# Patient Record
Sex: Female | Born: 1935 | Race: White | Hispanic: No | Smoking: Never smoker
Health system: Southern US, Community
[De-identification: ages and names within clinical notes are randomized; demographics above are authoritative.]

## PROBLEM LIST (undated history)

## (undated) DIAGNOSIS — E039 Hypothyroidism, unspecified: Secondary | ICD-10-CM

## (undated) DIAGNOSIS — H905 Unspecified sensorineural hearing loss: Secondary | ICD-10-CM

## (undated) DIAGNOSIS — I10 Essential (primary) hypertension: Secondary | ICD-10-CM

## (undated) DIAGNOSIS — R079 Chest pain, unspecified: Secondary | ICD-10-CM

## (undated) DIAGNOSIS — C49A0 Gastrointestinal stromal tumor, unspecified site: Secondary | ICD-10-CM

## (undated) DIAGNOSIS — H409 Unspecified glaucoma: Secondary | ICD-10-CM

## (undated) DIAGNOSIS — H269 Unspecified cataract: Secondary | ICD-10-CM

## (undated) DIAGNOSIS — N289 Disorder of kidney and ureter, unspecified: Secondary | ICD-10-CM

## (undated) DIAGNOSIS — C801 Malignant (primary) neoplasm, unspecified: Secondary | ICD-10-CM

## (undated) HISTORY — DX: Chest pain, unspecified: R07.9

## (undated) HISTORY — DX: Hypothyroidism, unspecified: E03.9

## (undated) HISTORY — PX: ABDOMINAL HYSTERECTOMY: SHX81

## (undated) HISTORY — DX: Unspecified cataract: H26.9

## (undated) HISTORY — DX: Disorder of kidney and ureter, unspecified: N28.9

## (undated) HISTORY — DX: Gastrointestinal stromal tumor, unspecified site: C49.A0

## (undated) HISTORY — DX: Unspecified sensorineural hearing loss: H90.5

## (undated) HISTORY — DX: Essential (primary) hypertension: I10

## (undated) HISTORY — DX: Unspecified glaucoma: H40.9

---

## 1998-03-12 ENCOUNTER — Ambulatory Visit: Admission: RE | Admit: 1998-03-12 | Discharge: 1998-03-12 | Payer: Self-pay | Admitting: Internal Medicine

## 1998-03-14 ENCOUNTER — Encounter: Payer: Self-pay | Admitting: Internal Medicine

## 2000-04-02 ENCOUNTER — Encounter: Admission: RE | Admit: 2000-04-02 | Discharge: 2000-04-02 | Payer: Self-pay | Admitting: Internal Medicine

## 2000-04-02 ENCOUNTER — Encounter: Payer: Self-pay | Admitting: Internal Medicine

## 2001-04-05 ENCOUNTER — Encounter: Admission: RE | Admit: 2001-04-05 | Discharge: 2001-04-05 | Payer: Self-pay | Admitting: Internal Medicine

## 2001-04-05 ENCOUNTER — Encounter: Payer: Self-pay | Admitting: Internal Medicine

## 2001-07-30 ENCOUNTER — Ambulatory Visit (HOSPITAL_COMMUNITY): Admission: RE | Admit: 2001-07-30 | Discharge: 2001-07-30 | Payer: Self-pay | Admitting: Internal Medicine

## 2002-04-07 ENCOUNTER — Encounter: Payer: Self-pay | Admitting: Internal Medicine

## 2002-04-07 ENCOUNTER — Encounter: Admission: RE | Admit: 2002-04-07 | Discharge: 2002-04-07 | Payer: Self-pay | Admitting: Internal Medicine

## 2003-04-10 ENCOUNTER — Encounter: Admission: RE | Admit: 2003-04-10 | Discharge: 2003-04-10 | Payer: Self-pay | Admitting: Internal Medicine

## 2003-04-10 ENCOUNTER — Encounter: Payer: Self-pay | Admitting: Internal Medicine

## 2004-04-10 ENCOUNTER — Encounter: Admission: RE | Admit: 2004-04-10 | Discharge: 2004-04-10 | Payer: Self-pay | Admitting: Internal Medicine

## 2004-07-21 HISTORY — PX: STOMACH SURGERY: SHX791

## 2004-11-29 ENCOUNTER — Encounter: Payer: Self-pay | Admitting: Gastroenterology

## 2005-04-14 ENCOUNTER — Encounter: Admission: RE | Admit: 2005-04-14 | Discharge: 2005-04-14 | Payer: Self-pay | Admitting: Internal Medicine

## 2006-04-15 ENCOUNTER — Encounter: Admission: RE | Admit: 2006-04-15 | Discharge: 2006-04-15 | Payer: Self-pay | Admitting: Internal Medicine

## 2006-04-16 ENCOUNTER — Encounter: Admission: RE | Admit: 2006-04-16 | Discharge: 2006-04-16 | Payer: Self-pay | Admitting: Internal Medicine

## 2006-04-28 ENCOUNTER — Encounter: Admission: RE | Admit: 2006-04-28 | Discharge: 2006-04-28 | Payer: Self-pay | Admitting: Internal Medicine

## 2006-07-01 ENCOUNTER — Encounter: Admission: RE | Admit: 2006-07-01 | Discharge: 2006-07-01 | Payer: Self-pay | Admitting: Internal Medicine

## 2006-07-21 DIAGNOSIS — C49A0 Gastrointestinal stromal tumor, unspecified site: Secondary | ICD-10-CM

## 2006-07-21 HISTORY — DX: Gastrointestinal stromal tumor, unspecified site: C49.A0

## 2006-08-06 ENCOUNTER — Emergency Department (HOSPITAL_COMMUNITY): Admission: EM | Admit: 2006-08-06 | Discharge: 2006-08-06 | Payer: Self-pay | Admitting: Emergency Medicine

## 2007-04-19 ENCOUNTER — Encounter: Admission: RE | Admit: 2007-04-19 | Discharge: 2007-04-19 | Payer: Self-pay | Admitting: Internal Medicine

## 2007-07-23 ENCOUNTER — Ambulatory Visit: Payer: Self-pay | Admitting: Internal Medicine

## 2007-08-05 ENCOUNTER — Ambulatory Visit: Payer: Self-pay | Admitting: Internal Medicine

## 2007-12-10 ENCOUNTER — Encounter: Admission: RE | Admit: 2007-12-10 | Discharge: 2007-12-10 | Payer: Self-pay | Admitting: Internal Medicine

## 2007-12-11 ENCOUNTER — Encounter: Admission: RE | Admit: 2007-12-11 | Discharge: 2007-12-11 | Payer: Self-pay | Admitting: Internal Medicine

## 2007-12-14 ENCOUNTER — Telehealth: Payer: Self-pay | Admitting: Gastroenterology

## 2007-12-16 DIAGNOSIS — R933 Abnormal findings on diagnostic imaging of other parts of digestive tract: Secondary | ICD-10-CM

## 2007-12-30 ENCOUNTER — Ambulatory Visit (HOSPITAL_COMMUNITY): Admission: RE | Admit: 2007-12-30 | Discharge: 2007-12-30 | Payer: Self-pay | Admitting: Gastroenterology

## 2007-12-30 ENCOUNTER — Encounter: Payer: Self-pay | Admitting: Gastroenterology

## 2008-01-03 ENCOUNTER — Encounter: Payer: Self-pay | Admitting: Gastroenterology

## 2008-01-06 ENCOUNTER — Ambulatory Visit: Payer: Self-pay | Admitting: Gastroenterology

## 2008-01-24 ENCOUNTER — Telehealth: Payer: Self-pay | Admitting: Gastroenterology

## 2008-02-03 ENCOUNTER — Encounter: Payer: Self-pay | Admitting: Gastroenterology

## 2008-03-28 ENCOUNTER — Encounter (INDEPENDENT_AMBULATORY_CARE_PROVIDER_SITE_OTHER): Payer: Self-pay | Admitting: General Surgery

## 2008-03-28 ENCOUNTER — Inpatient Hospital Stay (HOSPITAL_COMMUNITY): Admission: RE | Admit: 2008-03-28 | Discharge: 2008-04-02 | Payer: Self-pay | Admitting: General Surgery

## 2008-04-06 ENCOUNTER — Ambulatory Visit: Payer: Self-pay | Admitting: Oncology

## 2008-04-06 ENCOUNTER — Encounter: Payer: Self-pay | Admitting: Gastroenterology

## 2008-04-19 ENCOUNTER — Encounter: Admission: RE | Admit: 2008-04-19 | Discharge: 2008-04-19 | Payer: Self-pay | Admitting: Internal Medicine

## 2008-05-15 LAB — CBC WITH DIFFERENTIAL/PLATELET
BASO%: 0.2 % (ref 0.0–2.0)
EOS%: 2.1 % (ref 0.0–7.0)
Eosinophils Absolute: 0.1 10*3/uL (ref 0.0–0.5)
LYMPH%: 24.7 % (ref 14.0–48.0)
MCHC: 33.9 g/dL (ref 32.0–36.0)
MCV: 87.1 fL (ref 81.0–101.0)
MONO%: 5 % (ref 0.0–13.0)
NEUT#: 3.9 10*3/uL (ref 1.5–6.5)
RBC: 4.1 10*6/uL (ref 3.70–5.32)
RDW: 13.2 % (ref 11.3–14.5)

## 2008-05-15 LAB — COMPREHENSIVE METABOLIC PANEL
ALT: 14 U/L (ref 0–35)
AST: 18 U/L (ref 0–37)
Albumin: 4.2 g/dL (ref 3.5–5.2)
Alkaline Phosphatase: 59 U/L (ref 39–117)
Potassium: 3.5 mEq/L (ref 3.5–5.3)
Sodium: 139 mEq/L (ref 135–145)
Total Bilirubin: 0.4 mg/dL (ref 0.3–1.2)
Total Protein: 6.5 g/dL (ref 6.0–8.3)

## 2008-05-18 ENCOUNTER — Encounter: Payer: Self-pay | Admitting: Gastroenterology

## 2008-06-12 ENCOUNTER — Emergency Department (HOSPITAL_COMMUNITY): Admission: EM | Admit: 2008-06-12 | Discharge: 2008-06-12 | Payer: Self-pay | Admitting: Emergency Medicine

## 2008-07-17 ENCOUNTER — Encounter: Admission: RE | Admit: 2008-07-17 | Discharge: 2008-07-17 | Payer: Self-pay | Admitting: Internal Medicine

## 2008-08-15 ENCOUNTER — Ambulatory Visit: Payer: Self-pay | Admitting: Oncology

## 2008-08-21 LAB — CBC WITH DIFFERENTIAL/PLATELET
Basophils Absolute: 0 10*3/uL (ref 0.0–0.1)
EOS%: 2.1 % (ref 0.0–7.0)
Eosinophils Absolute: 0.1 10*3/uL (ref 0.0–0.5)
HGB: 12.7 g/dL (ref 11.6–15.9)
NEUT#: 3.5 10*3/uL (ref 1.5–6.5)
RDW: 13.4 % (ref 11.3–14.5)
lymph#: 1.8 10*3/uL (ref 0.9–3.3)

## 2008-08-21 LAB — COMPREHENSIVE METABOLIC PANEL
AST: 21 U/L (ref 0–37)
Albumin: 3.8 g/dL (ref 3.5–5.2)
BUN: 13 mg/dL (ref 6–23)
Calcium: 9.7 mg/dL (ref 8.4–10.5)
Chloride: 107 mEq/L (ref 96–112)
Glucose, Bld: 107 mg/dL — ABNORMAL HIGH (ref 70–99)
Potassium: 3.9 mEq/L (ref 3.5–5.3)
Sodium: 142 mEq/L (ref 135–145)
Total Protein: 7.1 g/dL (ref 6.0–8.3)

## 2008-09-04 ENCOUNTER — Ambulatory Visit (HOSPITAL_COMMUNITY): Admission: RE | Admit: 2008-09-04 | Discharge: 2008-09-04 | Payer: Self-pay | Admitting: Oncology

## 2008-11-30 ENCOUNTER — Inpatient Hospital Stay (HOSPITAL_COMMUNITY): Admission: EM | Admit: 2008-11-30 | Discharge: 2008-12-01 | Payer: Self-pay | Admitting: Emergency Medicine

## 2009-01-09 ENCOUNTER — Ambulatory Visit: Payer: Self-pay | Admitting: Oncology

## 2009-01-11 ENCOUNTER — Encounter: Payer: Self-pay | Admitting: Gastroenterology

## 2009-01-11 LAB — COMPREHENSIVE METABOLIC PANEL
Alkaline Phosphatase: 70 U/L (ref 39–117)
BUN: 13 mg/dL (ref 6–23)
CO2: 24 mEq/L (ref 19–32)
Creatinine, Ser: 0.81 mg/dL (ref 0.40–1.20)
Glucose, Bld: 86 mg/dL (ref 70–99)
Sodium: 142 mEq/L (ref 135–145)
Total Bilirubin: 0.6 mg/dL (ref 0.3–1.2)
Total Protein: 6.7 g/dL (ref 6.0–8.3)

## 2009-01-11 LAB — CBC WITH DIFFERENTIAL/PLATELET
Eosinophils Absolute: 0.1 10*3/uL (ref 0.0–0.5)
HCT: 36.1 % (ref 34.8–46.6)
LYMPH%: 28.6 % (ref 14.0–49.7)
MCV: 86.6 fL (ref 79.5–101.0)
MONO#: 0.3 10*3/uL (ref 0.1–0.9)
MONO%: 4.9 % (ref 0.0–14.0)
NEUT#: 3.7 10*3/uL (ref 1.5–6.5)
NEUT%: 64.4 % (ref 38.4–76.8)
Platelets: 193 10*3/uL (ref 145–400)
RBC: 4.17 10*6/uL (ref 3.70–5.45)

## 2009-01-11 LAB — LACTATE DEHYDROGENASE: LDH: 152 U/L (ref 94–250)

## 2009-02-05 ENCOUNTER — Encounter (INDEPENDENT_AMBULATORY_CARE_PROVIDER_SITE_OTHER): Payer: Self-pay | Admitting: *Deleted

## 2009-03-13 ENCOUNTER — Ambulatory Visit: Payer: Self-pay | Admitting: Gastroenterology

## 2009-04-05 ENCOUNTER — Ambulatory Visit: Payer: Self-pay | Admitting: Gastroenterology

## 2009-04-05 ENCOUNTER — Ambulatory Visit (HOSPITAL_COMMUNITY): Admission: RE | Admit: 2009-04-05 | Discharge: 2009-04-05 | Payer: Self-pay | Admitting: Gastroenterology

## 2009-04-20 ENCOUNTER — Encounter: Admission: RE | Admit: 2009-04-20 | Discharge: 2009-04-20 | Payer: Self-pay | Admitting: Internal Medicine

## 2009-05-14 ENCOUNTER — Ambulatory Visit: Payer: Self-pay | Admitting: Oncology

## 2009-05-22 ENCOUNTER — Encounter: Payer: Self-pay | Admitting: Gastroenterology

## 2009-05-24 ENCOUNTER — Ambulatory Visit (HOSPITAL_COMMUNITY): Admission: RE | Admit: 2009-05-24 | Discharge: 2009-05-24 | Payer: Self-pay | Admitting: Oncology

## 2009-05-24 LAB — CBC WITH DIFFERENTIAL/PLATELET
EOS%: 1.3 % (ref 0.0–7.0)
LYMPH%: 27.1 % (ref 14.0–49.7)
MCH: 30.2 pg (ref 25.1–34.0)
MCHC: 33.7 g/dL (ref 31.5–36.0)
MCV: 89.6 fL (ref 79.5–101.0)
MONO%: 4.6 % (ref 0.0–14.0)
RBC: 4.29 10*6/uL (ref 3.70–5.45)
RDW: 13.4 % (ref 11.2–14.5)

## 2009-05-24 LAB — COMPREHENSIVE METABOLIC PANEL
AST: 17 U/L (ref 0–37)
Albumin: 4.3 g/dL (ref 3.5–5.2)
Alkaline Phosphatase: 71 U/L (ref 39–117)
Potassium: 4 mEq/L (ref 3.5–5.3)
Sodium: 144 mEq/L (ref 135–145)
Total Bilirubin: 0.5 mg/dL (ref 0.3–1.2)
Total Protein: 6.9 g/dL (ref 6.0–8.3)

## 2009-11-16 ENCOUNTER — Ambulatory Visit: Payer: Self-pay | Admitting: Oncology

## 2009-11-19 ENCOUNTER — Ambulatory Visit (HOSPITAL_COMMUNITY): Admission: RE | Admit: 2009-11-19 | Discharge: 2009-11-19 | Payer: Self-pay | Admitting: Oncology

## 2009-11-19 LAB — CBC WITH DIFFERENTIAL/PLATELET
Basophils Absolute: 0 10*3/uL (ref 0.0–0.1)
HCT: 38.1 % (ref 34.8–46.6)
HGB: 12.9 g/dL (ref 11.6–15.9)
LYMPH%: 26.2 % (ref 14.0–49.7)
MONO#: 0.3 10*3/uL (ref 0.1–0.9)
NEUT%: 60.6 % (ref 38.4–76.8)
Platelets: 222 10*3/uL (ref 145–400)
WBC: 6.4 10*3/uL (ref 3.9–10.3)
lymph#: 1.7 10*3/uL (ref 0.9–3.3)

## 2009-11-19 LAB — COMPREHENSIVE METABOLIC PANEL
BUN: 22 mg/dL (ref 6–23)
CO2: 27 mEq/L (ref 19–32)
Calcium: 9.4 mg/dL (ref 8.4–10.5)
Chloride: 105 mEq/L (ref 96–112)
Creatinine, Ser: 0.87 mg/dL (ref 0.40–1.20)
Glucose, Bld: 124 mg/dL — ABNORMAL HIGH (ref 70–99)
Total Bilirubin: 0.8 mg/dL (ref 0.3–1.2)

## 2009-11-19 LAB — LACTATE DEHYDROGENASE: LDH: 164 U/L (ref 94–250)

## 2010-02-04 ENCOUNTER — Emergency Department (HOSPITAL_COMMUNITY): Admission: EM | Admit: 2010-02-04 | Discharge: 2010-02-04 | Payer: Self-pay | Admitting: Emergency Medicine

## 2010-04-22 ENCOUNTER — Encounter: Admission: RE | Admit: 2010-04-22 | Discharge: 2010-04-22 | Payer: Self-pay | Admitting: Internal Medicine

## 2010-05-22 ENCOUNTER — Ambulatory Visit: Payer: Self-pay | Admitting: Oncology

## 2010-05-24 ENCOUNTER — Encounter: Payer: Self-pay | Admitting: Gastroenterology

## 2010-05-24 LAB — LACTATE DEHYDROGENASE: LDH: 162 U/L (ref 94–250)

## 2010-05-24 LAB — COMPREHENSIVE METABOLIC PANEL
AST: 17 U/L (ref 0–37)
BUN: 17 mg/dL (ref 6–23)
Calcium: 10 mg/dL (ref 8.4–10.5)
Chloride: 103 mEq/L (ref 96–112)
Creatinine, Ser: 1.11 mg/dL (ref 0.40–1.20)

## 2010-05-24 LAB — CBC WITH DIFFERENTIAL/PLATELET
Basophils Absolute: 0 10*3/uL (ref 0.0–0.1)
EOS%: 1 % (ref 0.0–7.0)
HCT: 38.4 % (ref 34.8–46.6)
HGB: 12.8 g/dL (ref 11.6–15.9)
MCH: 29.2 pg (ref 25.1–34.0)
MCV: 87.7 fL (ref 79.5–101.0)
NEUT%: 73.4 % (ref 38.4–76.8)
Platelets: 224 10*3/uL (ref 145–400)
lymph#: 1.3 10*3/uL (ref 0.9–3.3)

## 2010-07-26 ENCOUNTER — Encounter
Admission: RE | Admit: 2010-07-26 | Discharge: 2010-07-26 | Payer: Self-pay | Source: Home / Self Care | Attending: Internal Medicine | Admitting: Internal Medicine

## 2010-08-09 ENCOUNTER — Other Ambulatory Visit (HOSPITAL_COMMUNITY): Payer: Self-pay | Admitting: Oncology

## 2010-08-09 DIAGNOSIS — C49A0 Gastrointestinal stromal tumor, unspecified site: Secondary | ICD-10-CM

## 2010-08-10 ENCOUNTER — Encounter: Payer: Self-pay | Admitting: Internal Medicine

## 2010-08-11 ENCOUNTER — Encounter (HOSPITAL_COMMUNITY): Payer: Self-pay | Admitting: Oncology

## 2010-08-20 NOTE — Letter (Signed)
Summary: [facility]  [facility]   Imported By: Lennie Odor 06/03/2010 14:24:40  _____________________________________________________________________  External Attachment:    Type:   Image     Comment:   External Document

## 2010-10-05 LAB — URINE MICROSCOPIC-ADD ON

## 2010-10-05 LAB — POCT I-STAT, CHEM 8
BUN: 20 mg/dL (ref 6–23)
Calcium, Ion: 1.07 mmol/L — ABNORMAL LOW (ref 1.12–1.32)
Chloride: 107 mEq/L (ref 96–112)
Creatinine, Ser: 0.9 mg/dL (ref 0.4–1.2)

## 2010-10-05 LAB — URINALYSIS, ROUTINE W REFLEX MICROSCOPIC
Bilirubin Urine: NEGATIVE
Glucose, UA: NEGATIVE mg/dL
Ketones, ur: NEGATIVE mg/dL
Protein, ur: NEGATIVE mg/dL
Urobilinogen, UA: 1 mg/dL (ref 0.0–1.0)

## 2010-10-05 LAB — CBC
MCV: 88.3 fL (ref 78.0–100.0)
Platelets: 186 10*3/uL (ref 150–400)
RBC: 4.13 MIL/uL (ref 3.87–5.11)
WBC: 12.4 10*3/uL — ABNORMAL HIGH (ref 4.0–10.5)

## 2010-10-05 LAB — URINE CULTURE: Culture  Setup Time: 201107181617

## 2010-10-05 LAB — PROTIME-INR
INR: 1.03 (ref 0.00–1.49)
Prothrombin Time: 13.4 seconds (ref 11.6–15.2)

## 2010-10-05 LAB — DIFFERENTIAL
Lymphocytes Relative: 6 % — ABNORMAL LOW (ref 12–46)
Lymphs Abs: 0.8 10*3/uL (ref 0.7–4.0)
Neutro Abs: 11.2 10*3/uL — ABNORMAL HIGH (ref 1.7–7.7)
Neutrophils Relative %: 91 % — ABNORMAL HIGH (ref 43–77)

## 2010-10-05 LAB — APTT: aPTT: 26 seconds (ref 24–37)

## 2010-10-29 LAB — COMPREHENSIVE METABOLIC PANEL
ALT: 14 U/L (ref 0–35)
AST: 24 U/L (ref 0–37)
Albumin: 3.6 g/dL (ref 3.5–5.2)
Alkaline Phosphatase: 76 U/L (ref 39–117)
Potassium: 3.5 mEq/L (ref 3.5–5.1)
Sodium: 140 mEq/L (ref 135–145)
Total Protein: 6.3 g/dL (ref 6.0–8.3)

## 2010-10-29 LAB — BASIC METABOLIC PANEL
Calcium: 8.4 mg/dL (ref 8.4–10.5)
GFR calc Af Amer: >60 mL/min (ref >60)
GFR calc non Af Amer: 50 mL/min — ABNORMAL LOW (ref >60)
Glucose, Bld: 94 mg/dL (ref 70–99)
Sodium: 142 mEq/L (ref 135–145)

## 2010-10-29 LAB — URINALYSIS, ROUTINE W REFLEX MICROSCOPIC
Glucose, UA: NEGATIVE mg/dL
Hgb urine dipstick: NEGATIVE
Ketones, ur: 40 mg/dL — AB
pH: 8 (ref 5.0–8.0)

## 2010-10-29 LAB — CBC
Platelets: 183 10*3/uL (ref 150–400)
RDW: 12.9 % (ref 11.5–15.5)

## 2010-10-29 LAB — URINE MICROSCOPIC-ADD ON

## 2010-10-29 LAB — URINE CULTURE

## 2010-10-29 LAB — LIPASE, BLOOD: Lipase: 19 U/L (ref 11–59)

## 2010-11-19 ENCOUNTER — Encounter (HOSPITAL_BASED_OUTPATIENT_CLINIC_OR_DEPARTMENT_OTHER): Payer: Medicare Other | Admitting: Oncology

## 2010-11-19 ENCOUNTER — Ambulatory Visit (HOSPITAL_COMMUNITY)
Admission: RE | Admit: 2010-11-19 | Discharge: 2010-11-19 | Disposition: A | Discharge status: Home / Self Care | Payer: Medicare Other | Source: Ambulatory Visit | Attending: Oncology | Admitting: Oncology

## 2010-11-19 ENCOUNTER — Other Ambulatory Visit (HOSPITAL_COMMUNITY): Payer: Self-pay | Admitting: Oncology

## 2010-11-19 ENCOUNTER — Encounter (HOSPITAL_COMMUNITY): Payer: Self-pay

## 2010-11-19 DIAGNOSIS — C165 Malignant neoplasm of lesser curvature of stomach, unspecified: Secondary | ICD-10-CM

## 2010-11-19 DIAGNOSIS — R1032 Left lower quadrant pain: Secondary | ICD-10-CM | POA: Insufficient documentation

## 2010-11-19 DIAGNOSIS — K449 Diaphragmatic hernia without obstruction or gangrene: Secondary | ICD-10-CM | POA: Insufficient documentation

## 2010-11-19 DIAGNOSIS — K7689 Other specified diseases of liver: Secondary | ICD-10-CM | POA: Insufficient documentation

## 2010-11-19 DIAGNOSIS — C49A0 Gastrointestinal stromal tumor, unspecified site: Secondary | ICD-10-CM

## 2010-11-19 DIAGNOSIS — Z9089 Acquired absence of other organs: Secondary | ICD-10-CM | POA: Insufficient documentation

## 2010-11-19 HISTORY — DX: Malignant (primary) neoplasm, unspecified: C80.1

## 2010-11-19 LAB — CMP (CANCER CENTER ONLY)
ALT(SGPT): 21 U/L (ref 10–47)
AST: 21 U/L (ref 11–38)
Albumin: 3.7 g/dL (ref 3.3–5.5)
BUN, Bld: 13 mg/dL (ref 7–22)
Calcium: 9.6 mg/dL (ref 8.0–10.3)
Chloride: 100 mEq/L (ref 98–108)
Potassium: 4.5 mEq/L (ref 3.3–4.7)
Sodium: 145 mEq/L (ref 128–145)
Total Protein: 7.3 g/dL (ref 6.4–8.1)

## 2010-11-19 LAB — CBC WITH DIFFERENTIAL/PLATELET
BASO%: 0.5 % (ref 0.0–2.0)
Basophils Absolute: 0 10*3/uL (ref 0.0–0.1)
EOS%: 1 % (ref 0.0–7.0)
HGB: 12.5 g/dL (ref 11.6–15.9)
MCH: 30 pg (ref 25.1–34.0)
RDW: 12.3 % (ref 11.2–14.5)
lymph#: 1.8 10*3/uL (ref 0.9–3.3)

## 2010-11-19 MED ORDER — IOHEXOL 300 MG/ML  SOLN
100.0000 mL | Freq: Once | INTRAMUSCULAR | Status: AC | PRN
  Administered 2010-11-19: 100 mL via INTRAVENOUS

## 2010-11-25 ENCOUNTER — Encounter (HOSPITAL_BASED_OUTPATIENT_CLINIC_OR_DEPARTMENT_OTHER): Payer: Medicare Other | Admitting: Oncology

## 2010-11-25 DIAGNOSIS — C165 Malignant neoplasm of lesser curvature of stomach, unspecified: Secondary | ICD-10-CM

## 2010-12-03 NOTE — Discharge Summary (Signed)
[name], Cheryl Yoder              ACCOUNT NO.:  [id number]   MEDICAL RECORD NO.:  [id number]          PATIENT TYPE:  INP   LOCATION:  1535                         FACILITY:  [facility]   PHYSICIAN:  Cheryl Yoder, M.D.DATE OF BIRTH:  January 30, 1928   DATE OF ADMISSION:  03/28/2008  DATE OF DISCHARGE:  04/02/2008                               DISCHARGE SUMMARY   FINAL DIAGNOSES:  1. Gastrointestinal stromal tumor of stomach, low grade.  2. Chronic cholecystitis with cholelithiasis.  3. Hypertension.  4. Hypothyroidism.   OPERATIVE PROCEDURES:  1. Exploratory laparotomy.  2. Resection of gastrointestinal stromal tumor of the stomach - Sept.      8, 2009  3. Cholecystectomy with cholangiogram -- March 28, 2008.   HISTORY:  This is a 77 year old white female who had at least 2 episodes  of epigastric pain over the past few months.  Ultrasound showed  gallstones, but also showed a very solid mass measuring at least 6-7 cm  -- thought to be in the left lobe of the liver.  Subsequent CT scan  revealed an 8.4 x 6.0 cm mass with a solitary calcification between the  stomach and the left lobe of the liver. There were also gallstones.  She  had endoscopy and endoscopic ultrasound by Dr. Rob Yoder, which  showed a normal esophagus, stomach and duodenum and no mucosal  abnormality.  The ultrasound findings did show a 7.5-cm mass that  appeared to communicate with the muscularis propria layer of the gastric  wall.  Mucous samples were done, suggesting spindle cell neoplasm and  favoring gastrointestinal stromal tumor.  She had a colonoscopy within  the past 12 months and that was not repeated.   I was asked to evaluate the patient as an outpatient, and she was  evaluated and counseled regarding surgery.  She was admitted electively  for resection of her gastric tumor and cholecystectomy.   HOSPITAL COURSE:  On the day of admission the patient was taken to the  operating room.  Through  an upper midline incision we explored and found  a very large, somewhat pedunculated tumor emanating from the lesser  curvature of the stomach.  There were no signs of any adenopathy or  invasion.  There was no adherence to surrounding structures.  We  resected locally the lesser curvature of the stomach, and closed the  gastric wall transversely.  We also performed a cholecystectomy and  cholangiogram, which was uneventful.   The final pathology report showed a gastrointestinal stromal tumor which  was very low grade.  There was no cytologic atypia, no mitotic activity  and no necrosis.  The margins were widely negative.  The gallbladder  just showed chronic inflammation and stones.   Postoperatively the patient did well.  She was maintained on  prophylactic Lopressor intravenously and prophylactic Lovenox for DVT  prophylaxis.  We took the NG tube and Foley catheter out on  postoperative day #2.  We advanced her diet and activities thereafter.  We discussed her pathology report with her.  I told her that as an  outpatient we would refer her  to an oncologist, although it was not  clear whether any further adjuvant treatment would be indicated due to  the low grade nature of her tumor.   She was discharged on September 13 feeling good, tolerating full  liquids; had a bowel movement and was passing flatus.  Her abdominal  exam was unremarkable.   She was asked to follow up with me in the office in about 1 week for  staple removal.  Diet and activities were discussed.      Cheryl Yoder, M.D.  Electronically Signed     HMI/MEDQ  D:  04/18/2008  T:  04/18/2008  Job:  [id number]   cc:   Cheryl Fee, MD  [address]  [place], [place] [address]   Cheryl Yoder, M.D.  Fax: [phone]

## 2010-12-03 NOTE — Discharge Summary (Signed)
Cheryl Yoder, Cheryl Yoder              ACCOUNT NO.:  [id number]   MEDICAL RECORD NO.:  [id number]          PATIENT TYPE:  INP   LOCATION:  6736                         FACILITY:  [facility]   PHYSICIAN:  Eduard Clos, MDDATE OF BIRTH:  [birth year]   DATE OF ADMISSION:  11/30/2008  DATE OF DISCHARGE:  12/01/2008                               DISCHARGE SUMMARY   COURSE IN HOSPITAL:  A 77 year old female with history of  hypothyroidism, hypertension, previous history of gistoma with several  complaints of nausea, vomiting with some diarrhea.  The patient admitted  to medical floor, started on IV fluids and gradually started diet and  the patient's condition slowly improved.  The patient had no diarrhea  and no nausea, vomiting.  She had mild fever.  The patient was placed on  Cipro and Flagyl.  At this time, the patient is tolerating diet, no  nausea, vomiting.  She is very eager to go home.  Fevers have settled.  Labs were remarkable.  UA showing possible UTI.  As the patient is  stable and eager to go home, the patient would be discharged home.  I  advised to follow with her primary care physician within a week's time.  At time of dictation, the patient is hemodynamically stable.   PROCEDURES DONE DURING THIS STAY:  Acute abdominal series on Nov 30, 2008 showed emphysema, cholecystectomy, nonobstructive bowel gas  pattern.   PERTINENT LABS:  The patient's AST, ALT, and lipase were all normal.  AST was 24, ALT 14, and lipase 19.  Her creatinine on Dec 01, 2008 on  day of discharge was 1.  UA showing moderate leukocytes and few  bacteria.   FINAL DIAGNOSES:  1. Acute gastroenteritis, resolved.  2. Possible urinary tract infection.  3. Hypotension.  4. Hyperlipidemia.   MEDICATIONS AT DISCHARGE:  1. Ciprofloxacin 250 mg b.i.d. for 3 days.  2. Lisinopril 10 mg p.o. daily.  3. Diovan 320  mg p.o. daily.  4. Terazosin 10 mg p.o. daily.  5. Synthroid 88 mcg daily.  6. Aspirin 81 mg  p.o. daily.   PLAN:  The patient advised to follow with Dr. Nila Nephew within a  week's time.  To be on a cardiac healthy diet, activity as tolerated.      Eduard Clos, MD  Electronically Signed     ANK/MEDQ  D:  12/01/2008  T:  12/02/2008  Job:  [id number]   cc:   Erskine Speed, M.D.

## 2010-12-03 NOTE — H&P (Signed)
Cheryl Yoder, Cheryl Yoder              ACCOUNT NO.:  [id number]   MEDICAL RECORD NO.:  [id number]          PATIENT TYPE:  INP   LOCATION:  6733                         FACILITY:  [facility]   PHYSICIAN:  Darryl D. Prime, MD    DATE OF BIRTH:  10/21/1927   DATE OF ADMISSION:  11/30/2008  DATE OF DISCHARGE:                              HISTORY & PHYSICAL   PRIMARY CARE PHYSICIAN:  [name].   CHIEF COMPLAINT:  Vomiting.   HISTORY OF PRESENT ILLNESS:  Cheryl Yoder a 77 year old female with a  history of hypertension, hypothyroidism who notes nausea and vomiting  food contents, no blood,  nonbilious, with ventilator dry heaves about  midnight on the day of admission.  The patient notes getting very weak  after, her daughter notes significant confusion afterwards as well. The  daughter notes a history of this in the past in (February 2009) and she  notes that dehydration usually causes significant confusion for her  mother.  The daughter is worried that she may have gastroenteritis,  which unfortunately she frequently has.  The patient denies any fever or  any sweats.  She denies any severe abdominal pain.  No black stools or  bloody stools, but the patient has had recent diarrhea associated with  this.  The patient denies any recent antibiotic use in the emergency  room.  There was a concern for urinary tract infection and admission was  requested.   PAST MEDICAL AND SURGICAL HISTORY:  As above.  She has a history of GIST  tumor with chronic cholecystitis, she is status post cholecystectomy and  GIST tumor resection in March 22, 2008.  She did not have nausea and  vomiting at that time when she presented for those lesions.  (Please  note the patient was seen for nausea and vomiting above, previously not  in February 2009, but in January 2008).  The patient also had an episode  of this in November 2009.  The patient has been on medicines for these  symptoms in the past.   SOCIAL HISTORY:   No history of tobacco, alcohol or drug use.   FAMILY HISTORY:  Th patient could not recall.   REVIEW OF SYSTEMS:  She could not give a good full review of systems due  to confusion.   PHYSICAL EXAMINATION:  VITAL Signs: Temperature is 97 with a blood  pressure 177/78, pulse of 96, respiratory rate of 24, saturations 100%  on room air. GENERAL:  She is a female who looks much younger than her  stated age sitting upright in no acute distress.  NEUROLOGICAL:  She is alert and oriented to person, but she thinks she  is in [place] instead of [employer].  She is unsure of the year.  HEENT: Normocephalic, atraumatic.  The oropharynx is dry.  NECK:  Supple with no lymphadenopathy or thyromegaly.  No carotid  bruits.  LUNGS:  Clear to auscultation bilaterally.  CARDIOVASCULAR EXAM:  Regular rhythm and rate with no murmurs.  ABDOMEN:  Soft, nondistended.  No hepatosplenomegaly.  The patient has  mild tenderness in the epigastrium  EXTREMITIES:  Show no clubbing, cyanosis or edema.   LABORATORY DATA:  White count is 10 with hemoglobin of 13.3, hematocrit  38.6, platelets of 183.  Sodium of 140 with potassium of 3.5, chloride  105, bicarb 25, BUN 15, creatinine 0.98 with glucose of 187, otherwise  normal LFTs.  Urinalysis showed WBCs 3-6, bacteria, and a few moderate  leukocytes.   ASSESSMENT/PLAN:  This is a patient with a history of apparent  gastroenteritis or bowel dysmotility with multiple episodes of this in  the past per the daughter.  There was a concern in the emergency room  for urinary tract infection but urinalysis seems reactively benign.  At  this time, she will be admitted to get a urine culture, Hemoccult stool  and we will place her on antiemetics and analgesics as needed.  We will  check Lasix and an abdominal series x-ray and continue her Cipro for now  for possible urinary tract infection also check a lipase. DVT and GI  prophylaxis will be ordered.      Darryl D.  Prime, MD  Electronically Signed     DDP/MEDQ  D:  11/30/2008  T:  11/30/2008  Job:  [id number]

## 2010-12-03 NOTE — Op Note (Signed)
NAMEPIERRE, Cheryl Yoder              ACCOUNT NO.:  [id number]   MEDICAL RECORD NO.:  [id number]          PATIENT TYPE:  INP   LOCATION:  0001                         FACILITY:  [facility]   PHYSICIAN:  Angelia Mould. Derrell Lolling, M.D.DATE OF BIRTH:  1927/10/12   DATE OF PROCEDURE:  03/28/2008  DATE OF DISCHARGE:                               OPERATIVE REPORT   PREOPERATIVE DIAGNOSES:  1. Gastrointestinal stromal tumor (GIST)  tumor of stomach.  2. Chronic cholecystitis with cholelithiasis.   POSTOPERATIVE DIAGNOSES:  1. Gastrointestinal stromal tumor (GIST)  tumor of stomach.  2. Chronic cholecystitis with cholelithiasis.   OPERATION PERFORMED:  Exploratory laparotomy, resection of GIST tumor of  stomach, cholecystectomy with intraoperative cholangiogram.   SURGEON:  Claud Kelp, MD.   FIRST ASSISTANT:  Chevis Pretty, MD.   OPERATIVE INDICATION:  This is a 77 year old white female who had a few  episodes of epigastric pain within the last few months.  An ultrasound  was obtained which showed gallstones and also suggested a large solid  mass in the left lobe of the liver measuring 7.5 cm.  Subsequent CT scan  shows what appears to be an 8.4 cm x 6.0 cm x 6.0 cm mass with solitary  calcification and heterogeneous enhancement situated between the stomach  and the left lobe of the liver.  Also again noted were gallstones and  diverticulosis.  Dr. Rob Bunting performed endoscopy and an endoscopic  ultrasound.  The mucosa of the esophagus and stomach looked normal as  did the duodenum.  Ultrasound showed a 7.5-cm mass that communicated  with the muscularis propria layer of the gastric wall along the lesser  curvature.  Needle samples were done and this suggested a spindle cell  neoplasm.  She was referred to me and she was counseled as an  outpatient.  She was advised that the tumor in her stomach would need to  be resected and that her gallbladder would also need to be resected.  She was very  willing to have this done and brought to the operating room  electively.   OPERATIVE FINDINGS:  The patient had a large , partially cystic,  baseball sized tumor emanating from the lesser curvature of the upper  body of the stomach and projecting medially.  This was not invasive or  adherent to any other structure.  There was no adenopathy.  There were  no other masses of the stomach or duodenum, no other masses of the small  intestine or large intestine.  There were no masses in the liver.  We  were able to widely resect this tumor by taking a large wedge out of the  lesser curvature of the stomach and closing it transversely and this did  not compromise the lumen at all.  The gallbladder appeared discolored  and chronically inflamed.  The cholangiogram was normal, showing normal  intrahepatic and extrahepatic bile ducts, no filling defect, and no  obstruction with good flow of contrast into the duodenum.   OPERATIVE TECHNIQUE:  Following the induction of general endotracheal  anesthesia the patient's abdomen was prepped and draped in sterile  fashion.  Intravenous antibiotics were given.  The patient was  identified as the correct patient and correct procedure.  An upper  midline laparotomy incision was made.  The fascia was incised in the  midline.  The abdominal cavity was entered and explored with findings as  described above.   Using the LigaSure, I took down the areas of the gastrocolic omentum as  well as the gastrohepatic omentum around the tumor and this gave me free  access to the stomach.  I placed a suture of #2-0 silk anterior and  posterior to the tumor and we found that I was able to lift up the tumor  and the full thickness of the gastric wall and place a TA-90 stapler,  with a green load of staples across this transversely, and closed this  down without compromising the lumen at all.  I fired the stapler and  then amputated the tissue.  I examined the tissue on the  back table and  it looked like I had a nice rim of full-thickness of gastric wall,  although it was a little close on the serosa in one area.  Dr. Laureen Ochs, in  the lab, said that there was no mucosal abnormality, but that it did  taper down toward the tumor.  I chose to resect another margin also  using the TA-90 stapler.  I used three Allis clamps to lift this up and  took about another 1 cm of full thickness of gastric wall by firing the  stapler and amputating the tissue with a knife.  I discussed this  specimen with Dr. Laureen Ochs who will evaluate this on routine histology.  The  staple line was hemostatic.  I oversewed the staple line with a running  locking suture of #2-0 Vicryl.  I irrigated this area out and there was  no bleeding immediately and there was no bleeding at the end of the  case.   I turned my attention to the gallbladder.  I placed a clamp on the  fundus, elevated that.  I took down a fair amount of adhesions to expose  the lower body and infundibulum of the gallbladder.  I incised the  peritoneum both medially and laterally.  I isolated the cystic artery as  it went under the wall of the gallbladder, secured it with metal clips  and divided it.  I created a very large window behind the cystic duct  getting a nice critical view.  A cholangiogram catheter was inserted  into the cystic duct and secured with a cholangiogram clamp.  A  cholangiogram was obtained using the C-arm.  The cholangiogram was  normal as described above.  The cholangiogram catheter was removed.  The  cystic duct was secured with multiple metal clips and divided.  The  gallbladder was then dissected from its bed with electrocautery and  sharp dissection and removed from the operative field.  The bed of the  gallbladder was cauterized and irrigated and was completely dry.  We  irrigated out the subphrenic space, subhepatic space and the upper  abdomen one more time.  There was no bleeding or bile leak  whatsoever.  Suture line on the stomach looked good.  A nasogastric tube was  positioned beyond the suture line in the stomach down into the gastric  antrum.  The midline fascia was closed with a running suture of #1  double-stranded PDS and skin closed with skin staples.  Clean bandages  were placed and the patient  taken to the recovery room in stable  condition.  Estimated blood loss was about 75 mL, complications none,  sponge, needle, instrument counts were correct.      Angelia Mould. Derrell Lolling, M.D.  Electronically Signed     HMI/MEDQ  D:  03/28/2008  T:  03/28/2008  Job:  [id number]   cc:   [name], MD  [address]  [place], [place] [address]   Erskine Speed, M.D.  Fax: [phone]

## 2011-03-03 ENCOUNTER — Other Ambulatory Visit: Payer: Self-pay | Admitting: Internal Medicine

## 2011-03-03 DIAGNOSIS — Z1231 Encounter for screening mammogram for malignant neoplasm of breast: Secondary | ICD-10-CM

## 2011-03-10 ENCOUNTER — Encounter: Payer: Self-pay | Admitting: Gastroenterology

## 2011-04-22 LAB — URINALYSIS, ROUTINE W REFLEX MICROSCOPIC
Ketones, ur: >80 — AB
Nitrite: NEGATIVE
Specific Gravity, Urine: 1.016
pH: 6.5

## 2011-04-22 LAB — COMPREHENSIVE METABOLIC PANEL
CO2: 22
Calcium: 8.9
Creatinine, Ser: 0.8
GFR calc Af Amer: >60
GFR calc non Af Amer: >60
Glucose, Bld: 163 — ABNORMAL HIGH

## 2011-04-22 LAB — DIFFERENTIAL
Lymphocytes Relative: 4 — ABNORMAL LOW
Lymphs Abs: 0.2 — ABNORMAL LOW
Neutro Abs: 5.4
Neutrophils Relative %: 94 — ABNORMAL HIGH

## 2011-04-22 LAB — LIPASE, BLOOD: Lipase: 14

## 2011-04-22 LAB — CBC
Hemoglobin: 12.8
MCHC: 33.6
MCV: 88.1
RBC: 4.32

## 2011-04-23 LAB — CBC
HCT: 34.6 — ABNORMAL LOW
HCT: 37.5
MCV: 89.7
Platelets: 206
Platelets: 233
RBC: 4.18
RDW: 13
WBC: 13.2 — ABNORMAL HIGH
WBC: 5.8

## 2011-04-23 LAB — COMPREHENSIVE METABOLIC PANEL
AST: 21
Albumin: 3.8
Alkaline Phosphatase: 67
BUN: 9
CO2: 30
Chloride: 106
GFR calc non Af Amer: >60
Potassium: 4.8
Total Bilirubin: 0.8

## 2011-04-23 LAB — URINALYSIS, ROUTINE W REFLEX MICROSCOPIC
Bilirubin Urine: NEGATIVE
Ketones, ur: NEGATIVE
Nitrite: NEGATIVE
Protein, ur: NEGATIVE
Urobilinogen, UA: 0.2

## 2011-04-23 LAB — DIFFERENTIAL
Basophils Absolute: 0
Basophils Relative: 1
Eosinophils Relative: 4
Monocytes Absolute: 0.3
Neutro Abs: 3.6

## 2011-04-23 LAB — BASIC METABOLIC PANEL
BUN: 9
CO2: 29
Chloride: 108
Glucose, Bld: 174 — ABNORMAL HIGH
Potassium: 4.1

## 2011-04-24 ENCOUNTER — Ambulatory Visit
Admission: RE | Admit: 2011-04-24 | Discharge: 2011-04-24 | Disposition: A | Discharge status: Home / Self Care | Payer: Medicare Other | Source: Ambulatory Visit | Attending: Internal Medicine | Admitting: Internal Medicine

## 2011-04-24 DIAGNOSIS — Z1231 Encounter for screening mammogram for malignant neoplasm of breast: Secondary | ICD-10-CM

## 2011-05-01 ENCOUNTER — Encounter: Payer: Self-pay | Admitting: Nurse Practitioner

## 2011-05-29 ENCOUNTER — Other Ambulatory Visit (HOSPITAL_BASED_OUTPATIENT_CLINIC_OR_DEPARTMENT_OTHER): Payer: Medicare Other | Admitting: Lab

## 2011-05-29 ENCOUNTER — Telehealth: Payer: Self-pay | Admitting: Oncology

## 2011-05-29 ENCOUNTER — Other Ambulatory Visit (HOSPITAL_COMMUNITY): Payer: Self-pay | Admitting: Oncology

## 2011-05-29 ENCOUNTER — Ambulatory Visit (HOSPITAL_BASED_OUTPATIENT_CLINIC_OR_DEPARTMENT_OTHER): Payer: Medicare Other | Admitting: Oncology

## 2011-05-29 VITALS — BP 150/67 | HR 80 | Temp 97.4°F | Ht 63.0 in | Wt 154.8 lb

## 2011-05-29 DIAGNOSIS — C494 Malignant neoplasm of connective and soft tissue of abdomen: Secondary | ICD-10-CM

## 2011-05-29 DIAGNOSIS — C49A0 Gastrointestinal stromal tumor, unspecified site: Secondary | ICD-10-CM

## 2011-05-29 LAB — CBC WITH DIFFERENTIAL/PLATELET
BASO%: 0.4 % (ref 0.0–2.0)
EOS%: 1.2 % (ref 0.0–7.0)
HCT: 36.2 % (ref 34.8–46.6)
MCH: 29.9 pg (ref 25.1–34.0)
MCHC: 33.9 g/dL (ref 31.5–36.0)
NEUT%: 67.9 % (ref 38.4–76.8)
lymph#: 1.5 10*3/uL (ref 0.9–3.3)

## 2011-05-29 LAB — COMPREHENSIVE METABOLIC PANEL
ALT: 13 U/L (ref 0–35)
AST: 18 U/L (ref 0–37)
Alkaline Phosphatase: 74 U/L (ref 39–117)
Calcium: 9.6 mg/dL (ref 8.4–10.5)
Chloride: 106 mEq/L (ref 96–112)
Creatinine, Ser: 0.89 mg/dL (ref 0.50–1.10)
Total Bilirubin: 0.4 mg/dL (ref 0.3–1.2)

## 2011-05-29 NOTE — Telephone Encounter (Signed)
gv pt appt schedule for may and appt for dr Christella Hartigan 11/20 @ 3:15 pm @ [facility].

## 2011-05-29 NOTE — Progress Notes (Signed)
  This office note has been dictated.  #[id number]

## 2011-05-29 NOTE — Progress Notes (Signed)
CC:   Cheryl Yoder. Cheryl Yoder, M.D. Cheryl Yoder Cheryl Yoder, M.D.  HISTORY:  Cheryl Yoder was seen today for followup of her gastric GI stromal tumor which involved the lesser curvature of the stomach.  It will be recalled that Cheryl Yoder was first seen by me on May 15, 2008.  Her tumor measured 7.5 cm, had a benign-looking pathology, specifically no cytologic atypia, mitotic activity or necrosis.  Margins were free.  The tumor was confined to the gastric wall.  There was no obvious adenopathy or evidence of metastatic disease.  Tumor stage was T2.  It was felt that the patient had an excellent prognosis with a cure rate after surgery alone somewhere around 95%.  It was felt the patient did not require adjuvant treatment.  Her last endoscopy with ultrasound by Dr. [name] was on April 05, 2009.  I had spoken with him a year ago, and he was contemplating repeat of the endoscopic ultrasound in the fall of this year.  The patient has had a CT scan of the abdomen and pelvis carried out on 11/19/2010 that was negative.  Cheryl Yoder continues to do remarkably well.  She is a youthful 77- year-old.  She has noticed over the past 6-8 months some mild dyspepsia and occasionally some very mild dysphagia which she relates to her upper esophagus.  The symptoms are rather mild.  Aside from this, the patient feels well.  She does have yearly mammograms.  MEDICINES:  Reviewed and recorded.  PHYSICAL EXAMINATION:  General:  The patient looks well.  Weight today is 154 pounds 12.8 ounces, height 5 feet 3 inches, body surface area 1.77 sq m. Vital Signs:  Blood pressure today 150/67.  Other vital signs are normal.  HEENT:  There is no scleral icterus.  Mouth and pharynx are benign.  Lymphatic:  No peripheral adenopathy palpable.  No axillary or inguinal adenopathy. Heart and Lungs:  Normal.  Breasts:  Not examined. Abdomen:  Benign, somewhat full feeling, but no definite  organomegaly or masses.  Extremities:  No peripheral edema or clubbing.  No calf tenderness.  LABORATORY DATA:  Today, white count 5.8, ANC 4.0, hemoglobin 12.3, hematocrit 36.2, and platelets 200,000.  Chemistries today are pending. Chemistries from 11/19/2010 were normal.  As stated, the patient's last CT scan of abdomen and pelvis was on 11/19/2010.  Her last endoscopic ultrasound was on 04/05/2009.  IMPRESSION AND PLAN:  [name] continues to do extraordinarily well with no signs of recurrent disease now 3-1/2 years from the time of diagnosis.  We will plan to see Cheryl Yoder again in 6 months, at which time we will check CBC and chemistries.  I am referring her to Dr. [name] for consideration of endoscopic ultrasound, as we discussed a year ago.    ______________________________ Samul Dada, M.D. DSM/MEDQ  D:  05/29/2011  T:  05/29/2011  Job:  [id number]

## 2011-06-03 ENCOUNTER — Telehealth: Payer: Self-pay

## 2011-06-03 ENCOUNTER — Other Ambulatory Visit: Payer: Self-pay | Admitting: Gastroenterology

## 2011-06-03 DIAGNOSIS — Z8509 Personal history of malignant neoplasm of other digestive organs: Secondary | ICD-10-CM

## 2011-06-03 NOTE — Telephone Encounter (Signed)
Her oncologist has asked that I repeat upper EUS for h/o GIST.  Can you call her. radial +/- linear EUS, does not need propofol, next available EUS day for h/o GIST. Thanks    Pt has been scheduled and meds reviewed pt will call with any concerns

## 2011-06-05 ENCOUNTER — Telehealth: Payer: Self-pay | Admitting: Gastroenterology

## 2011-06-05 NOTE — Telephone Encounter (Signed)
Left message on machine to call back  

## 2011-06-06 NOTE — Telephone Encounter (Signed)
Left message on machine to call back  

## 2011-06-09 NOTE — Telephone Encounter (Signed)
Ok, thanks.  Put her in recall system for eus jan 2013.

## 2011-06-09 NOTE — Telephone Encounter (Signed)
Pt called saying she wants to put off her EUS until after the first of the year, Dr Johnette Abraham will be notified.

## 2011-06-09 NOTE — Telephone Encounter (Signed)
Recall in EPIC 

## 2011-06-10 ENCOUNTER — Ambulatory Visit: Payer: Medicare Other | Admitting: Gastroenterology

## 2011-06-19 ENCOUNTER — Ambulatory Visit (HOSPITAL_COMMUNITY): Admission: RE | Admit: 2011-06-19 | Payer: Medicare Other | Source: Ambulatory Visit | Admitting: Gastroenterology

## 2011-06-19 ENCOUNTER — Encounter (HOSPITAL_COMMUNITY): Admission: RE | Payer: Self-pay | Source: Ambulatory Visit

## 2011-06-19 SURGERY — UPPER ENDOSCOPIC ULTRASOUND (EUS) LINEAR
Anesthesia: Moderate Sedation

## 2011-07-16 ENCOUNTER — Ambulatory Visit (INDEPENDENT_AMBULATORY_CARE_PROVIDER_SITE_OTHER): Payer: Medicare Other | Admitting: Gastroenterology

## 2011-07-16 ENCOUNTER — Encounter: Payer: Self-pay | Admitting: Gastroenterology

## 2011-07-16 DIAGNOSIS — R1310 Dysphagia, unspecified: Secondary | ICD-10-CM

## 2011-07-16 DIAGNOSIS — C494 Malignant neoplasm of connective and soft tissue of abdomen: Secondary | ICD-10-CM

## 2011-07-16 DIAGNOSIS — C49A0 Gastrointestinal stromal tumor, unspecified site: Secondary | ICD-10-CM

## 2011-07-16 NOTE — Progress Notes (Signed)
Review of pertinent gastrointestinal problems: 1. 8cm T2 gastric GIST removed 2009 by Dr. Derrell Lolling. 2010 EUS showed no sign of recurrent lesion. 5/12 CT scan showed no sign of recurrence.  HPI: This is a  very pleasant 77 year old woman whom I last saw about 2 years ago at the time of the repeat upper endoscopic ultrasound. See those results summarized above.  She feels good. The "only problem" is a feeling of dysphagia intermittently.  This occurs with solids only.  abouyt once a week.  Usually just the first bite.  She will have to vomit to get it out.  THis is new, 5-6 months.  She has occasional pyrosis after a big meal. This new as well.   She has gained a few pounds.  No abx in several months.  No etoh, non smoker, one cup coffee in am.    Past Medical History  Diagnosis Date  . Cancer     gist  . GIST (gastrointestinal stromal tumor), malignant   . Hypertension   . Hypothyroid     Past Surgical History  Procedure Date  . Stomach surgery     Current Outpatient Prescriptions  Medication Sig Dispense Refill  . aspirin 81 MG chewable tablet Chew 81 mg by mouth daily.        . calcium-vitamin D (OSCAL) 250-125 MG-UNIT per tablet Take 1 tablet by mouth daily.        Marland Kitchen levothyroxine (SYNTHROID, LEVOTHROID) 75 MCG tablet Take 75 mcg by mouth daily.        Marland Kitchen lisinopril (PRINIVIL,ZESTRIL) 10 MG tablet Take 10 mg by mouth daily. Take 2  tabs daily      . losartan (COZAAR) 100 MG tablet Take 100 mg by mouth daily.        Marland Kitchen terazosin (HYTRIN) 10 MG capsule Take 10 mg by mouth at bedtime.          Allergies as of 07/16/2011 - Review Complete 07/16/2011  Allergen Reaction Noted  . Codeine sulfate  05/01/2011  . Penicillins  03/08/2009  . Sulfonamide derivatives  03/08/2009    No family history on file.  History   Social History  . Marital Status: Widowed    Spouse Name: N/A    Number of Children: 2  . Years of Education: N/A   Occupational History  . Retired    Social  History Main Topics  . Smoking status: Never Smoker   . Smokeless tobacco: Never Used  . Alcohol Use: No  . Drug Use: No  . Sexually Active: Not on file   Other Topics Concern  . Not on file   Social History Narrative  . No narrative on file      Physical Exam: BP 132/58  Pulse 68  Ht 5\' 3"  (1.6 m)  Wt 152 lb (68.947 kg)  BMI 26.93 kg/m2 Constitutional: generally well-appearing Psychiatric: alert and oriented x3 Abdomen: soft, nontender, nondistended, no obvious ascites, no peritoneal signs, normal bowel sounds     Assessment and plan: 78 y.o. female with history of large gastric Gist, new dysphagia, new pyrosis  I suspect her dysphagia and her GERD symptoms are related. We have given her samples of proton pump inhibitor which she will begin taking one pill once daily. We will also proceed with EGD at her soonest convenience with endoscopic ultrasound at the hospital.

## 2011-07-16 NOTE — Patient Instructions (Signed)
Upper EUS, EGD at [facility] for history of GIST, dysphagia. Samples of PPI (antiacid medicine) given. Take one pill once daily 20-30 min before mealtime.   A copy of this information will be made available to Drs Murinson and Chilton Si.

## 2011-08-14 ENCOUNTER — Encounter (HOSPITAL_COMMUNITY)
Admission: RE | Disposition: A | Discharge status: Home / Self Care | Payer: Self-pay | Source: Ambulatory Visit | Attending: Gastroenterology

## 2011-08-14 ENCOUNTER — Encounter (HOSPITAL_COMMUNITY): Payer: Self-pay | Admitting: *Deleted

## 2011-08-14 ENCOUNTER — Ambulatory Visit (HOSPITAL_COMMUNITY)
Admission: RE | Admit: 2011-08-14 | Discharge: 2011-08-14 | Disposition: A | Discharge status: Home / Self Care | Payer: Medicare Other | Source: Ambulatory Visit | Attending: Gastroenterology | Admitting: Gastroenterology

## 2011-08-14 DIAGNOSIS — Z7982 Long term (current) use of aspirin: Secondary | ICD-10-CM | POA: Insufficient documentation

## 2011-08-14 DIAGNOSIS — I10 Essential (primary) hypertension: Secondary | ICD-10-CM | POA: Insufficient documentation

## 2011-08-14 DIAGNOSIS — E039 Hypothyroidism, unspecified: Secondary | ICD-10-CM | POA: Insufficient documentation

## 2011-08-14 DIAGNOSIS — K222 Esophageal obstruction: Secondary | ICD-10-CM

## 2011-08-14 DIAGNOSIS — K449 Diaphragmatic hernia without obstruction or gangrene: Secondary | ICD-10-CM | POA: Insufficient documentation

## 2011-08-14 DIAGNOSIS — Z79899 Other long term (current) drug therapy: Secondary | ICD-10-CM | POA: Insufficient documentation

## 2011-08-14 DIAGNOSIS — C494 Malignant neoplasm of connective and soft tissue of abdomen: Secondary | ICD-10-CM | POA: Insufficient documentation

## 2011-08-14 DIAGNOSIS — C49A0 Gastrointestinal stromal tumor, unspecified site: Secondary | ICD-10-CM

## 2011-08-14 DIAGNOSIS — R1310 Dysphagia, unspecified: Secondary | ICD-10-CM

## 2011-08-14 HISTORY — PX: ESOPHAGOGASTRODUODENOSCOPY: SHX5428

## 2011-08-14 HISTORY — PX: EUS: SHX5427

## 2011-08-14 SURGERY — UPPER ENDOSCOPIC ULTRASOUND (EUS) LINEAR
Anesthesia: Moderate Sedation

## 2011-08-14 MED ORDER — FENTANYL CITRATE 0.05 MG/ML IJ SOLN
INTRAMUSCULAR | Status: AC
  Filled 2011-08-14: qty 2

## 2011-08-14 MED ORDER — FENTANYL CITRATE 0.05 MG/ML IJ SOLN
INTRAMUSCULAR | Status: DC | PRN
  Administered 2011-08-14 (×3): 25 ug via INTRAVENOUS

## 2011-08-14 MED ORDER — MIDAZOLAM HCL 10 MG/2ML IJ SOLN
INTRAMUSCULAR | Status: AC
  Filled 2011-08-14: qty 2

## 2011-08-14 MED ORDER — BUTAMBEN-TETRACAINE-BENZOCAINE 2-2-14 % EX AERO
INHALATION_SPRAY | CUTANEOUS | Status: DC | PRN
  Administered 2011-08-14: 2 via TOPICAL

## 2011-08-14 MED ORDER — SODIUM CHLORIDE 0.9 % IV SOLN
INTRAVENOUS | Status: DC
  Administered 2011-08-14: 500 mL via INTRAVENOUS

## 2011-08-14 MED ORDER — MIDAZOLAM HCL 10 MG/2ML IJ SOLN
INTRAMUSCULAR | Status: DC | PRN
  Administered 2011-08-14 (×2): 2 mg via INTRAVENOUS
  Administered 2011-08-14: 1 mg via INTRAVENOUS
  Administered 2011-08-14: 2 mg via INTRAVENOUS

## 2011-08-14 NOTE — Interval H&P Note (Signed)
History and Physical Interval Note:  08/14/2011 7:16 AM  Tawni Rennis Golden  has presented today for surgery, with the diagnosis of GIST, malignant [171.5]dysphagia  The various methods of treatment have been discussed with the patient and family. After consideration of risks, benefits and other options for treatment, the patient has consented to  Procedure(s): UPPER ENDOSCOPIC ULTRASOUND (EUS) LINEAR ESOPHAGOGASTRODUODENOSCOPY (EGD) as a surgical intervention .  The patients' history has been reviewed, patient examined, no change in status, stable for surgery.  I have reviewed the patients' chart and labs.  Questions were answered to the patient's satisfaction.     Rob Bunting

## 2011-08-14 NOTE — Op Note (Signed)
[facility] [address] [place], [place]  [address]  ENDOSCOPIC ULTRASOUND PROCEDURE REPORT  PATIENT:  Cheryl Yoder, Cheryl Yoder  MR#:  [id number] BIRTHDATE:  Feb 26, 1928  GENDER:  female ENDOSCOPIST:  [name], MD PROCEDURE DATE:  08/14/2011 PROCEDURE:  Upper EUS and EGD with dilation/balloon ASA CLASS:  Class II INDICATIONS:  2009 Large gastric GIST removed surgically, 2010 EUS no sign of recurrence; now for repeat EUS and also evaluation of intermittent dysphagia MEDICATIONS:  Fentanyl 75 mcg IV, Versed 7 mg IV DESCRIPTION OF PROCEDURE:   After the risks benefits and alternatives of the procedure were  explained, informed consent was obtained. The patient was then placed in the left, lateral, decubitus postion and IV sedation was administered. Throughout the procedure, the patient's blood pressure, pulse and oxygen saturations were monitored continuously.  Under direct visualization, the  endoscope was introduced through the mouth and advanced to the second portion of the duodenum.  Water was used as necessary to provide an acoustic interface.  Upon completion of the imaging, water was removed and the patient was sent to the recovery room in satisfactory condition. <<PROCEDUREIMAGES>>  Endoscopic findings: 1. Distally, the esophagus was tortuous and there was a focal Schatzki's like ring at GE junction above a small hiatal hernia. Following EUS examination the ring was dilated with a 18mm CRE TTS balloon held inflated for 60 seconds. There was the usual minor mucosal tear and self limited bleeding. 2. Post wedge resection anatomy to stomach, no muocsal abnormalities 3. Normal duodenum.  EUS findings: 1. The gastric was was anatomically abnormal at site of wedge resection site but there were no clear masses, tumors involving the wall of the stomach 2. No perigastric adenopathy. 3. Limited views of livers, pancreas, spleen were all normal  Impression: No sign  of recurrent GIST in stomach wall (previous distal gastric wedge resection). Tortuous distal esophagus and Schatzki's ring, this was dilated with 18mm balloon.  ______________________________ [name], MD  cc: Elmore Guise, MD; Claud Kelp, MD; Johney Frame, MD  n. eSIGNED:   [name] at 08/14/2011 08:02 AM  Vicenta Aly, [id number]

## 2011-08-14 NOTE — H&P (View-Only) (Signed)
Review of pertinent gastrointestinal problems: 1. 8cm T2 gastric GIST removed 2009 by Dr. Ingram. 2010 EUS showed no sign of recurrent lesion. 5/12 CT scan showed no sign of recurrence.  HPI: This is a  very pleasant 78-year-old woman whom I last saw about 2 years ago at the time of the repeat upper endoscopic ultrasound. See those results summarized above.  She feels good. The "only problem" is a feeling of dysphagia intermittently.  This occurs with solids only.  abouyt once a week.  Usually just the first bite.  She will have to vomit to get it out.  THis is new, 5-6 months.  She has occasional pyrosis after a big meal. This new as well.   She has gained a few pounds.  No abx in several months.  No etoh, non smoker, one cup coffee in am.    Past Medical History  Diagnosis Date  . Cancer     gist  . GIST (gastrointestinal stromal tumor), malignant   . Hypertension   . Hypothyroid     Past Surgical History  Procedure Date  . Stomach surgery     Current Outpatient Prescriptions  Medication Sig Dispense Refill  . aspirin 81 MG chewable tablet Chew 81 mg by mouth daily.        . calcium-vitamin D (OSCAL) 250-125 MG-UNIT per tablet Take 1 tablet by mouth daily.        . levothyroxine (SYNTHROID, LEVOTHROID) 75 MCG tablet Take 75 mcg by mouth daily.        . lisinopril (PRINIVIL,ZESTRIL) 10 MG tablet Take 10 mg by mouth daily. Take 2  tabs daily      . losartan (COZAAR) 100 MG tablet Take 100 mg by mouth daily.        . terazosin (HYTRIN) 10 MG capsule Take 10 mg by mouth at bedtime.          Allergies as of 07/16/2011 - Review Complete 07/16/2011  Allergen Reaction Noted  . Codeine sulfate  05/01/2011  . Penicillins  03/08/2009  . Sulfonamide derivatives  03/08/2009    No family history on file.  History   Social History  . Marital Status: Widowed    Spouse Name: N/A    Number of Children: 2  . Years of Education: N/A   Occupational History  . Retired    Social  History Main Topics  . Smoking status: Never Smoker   . Smokeless tobacco: Never Used  . Alcohol Use: No  . Drug Use: No  . Sexually Active: Not on file   Other Topics Concern  . Not on file   Social History Narrative  . No narrative on file      Physical Exam: BP 132/58  Pulse 68  Ht 5' 3" (1.6 m)  Wt 152 lb (68.947 kg)  BMI 26.93 kg/m2 Constitutional: generally well-appearing Psychiatric: alert and oriented x3 Abdomen: soft, nontender, nondistended, no obvious ascites, no peritoneal signs, normal bowel sounds     Assessment and plan: 78 y.o. female with history of large gastric Gist, new dysphagia, new pyrosis  I suspect her dysphagia and her GERD symptoms are related. We have given her samples of proton pump inhibitor which she will begin taking one pill once daily. We will also proceed with EGD at her soonest convenience with endoscopic ultrasound at the hospital.  

## 2011-08-14 NOTE — Progress Notes (Signed)
Patient tolerated po fluids well prior to discharge

## 2011-11-11 ENCOUNTER — Telehealth: Payer: Self-pay | Admitting: Oncology

## 2011-11-11 NOTE — Telephone Encounter (Signed)
pt had called to r/s 5/9,pt called and r/s to 5/13  aom

## 2011-11-27 ENCOUNTER — Ambulatory Visit: Payer: Medicare Other | Admitting: Oncology

## 2011-11-27 ENCOUNTER — Other Ambulatory Visit: Payer: Medicare Other | Admitting: Lab

## 2011-12-01 ENCOUNTER — Other Ambulatory Visit (HOSPITAL_BASED_OUTPATIENT_CLINIC_OR_DEPARTMENT_OTHER): Payer: Medicare Other | Admitting: Lab

## 2011-12-01 ENCOUNTER — Ambulatory Visit (HOSPITAL_BASED_OUTPATIENT_CLINIC_OR_DEPARTMENT_OTHER): Payer: Medicare Other | Admitting: Oncology

## 2011-12-01 ENCOUNTER — Telehealth: Payer: Self-pay | Admitting: Oncology

## 2011-12-01 ENCOUNTER — Encounter: Payer: Self-pay | Admitting: Oncology

## 2011-12-01 VITALS — BP 142/69 | HR 80 | Temp 96.3°F | Ht 63.0 in | Wt 154.7 lb

## 2011-12-01 DIAGNOSIS — C165 Malignant neoplasm of lesser curvature of stomach, unspecified: Secondary | ICD-10-CM

## 2011-12-01 DIAGNOSIS — C494 Malignant neoplasm of connective and soft tissue of abdomen: Secondary | ICD-10-CM

## 2011-12-01 DIAGNOSIS — C49A0 Gastrointestinal stromal tumor, unspecified site: Secondary | ICD-10-CM

## 2011-12-01 LAB — CBC WITH DIFFERENTIAL/PLATELET
EOS%: 1.5 % (ref 0.0–7.0)
Eosinophils Absolute: 0.1 10*3/uL (ref 0.0–0.5)
MCV: 88.1 fL (ref 79.5–101.0)
MONO%: 4.9 % (ref 0.0–14.0)
NEUT#: 5 10*3/uL (ref 1.5–6.5)
RBC: 4.11 10*6/uL (ref 3.70–5.45)
RDW: 13 % (ref 11.2–14.5)
lymph#: 1.9 10*3/uL (ref 0.9–3.3)
nRBC: 0 % (ref 0–0)

## 2011-12-01 LAB — COMPREHENSIVE METABOLIC PANEL
ALT: 9 U/L (ref 0–35)
Alkaline Phosphatase: 75 U/L (ref 39–117)
Sodium: 140 mEq/L (ref 135–145)
Total Bilirubin: 0.4 mg/dL (ref 0.3–1.2)
Total Protein: 6.3 g/dL (ref 6.0–8.3)

## 2011-12-01 NOTE — Progress Notes (Signed)
CC:   Cheryl Fee, MD Angelia Mould. Derrell Lolling, M.D. Erskine Speed, M.D.  PROBLEM LIST: 1. Gastrointestinal stromal tumor of the lesser curvature measuring     7.5 cm with biopsy carried out on 12/30/2007 and definitive surgery     which was a complete resection on 03/28/2008.  Margins were     negative.  There was no angiolymphatic or perineural invasion.  C-     KIT and CD34 were both positive.  The patient remains without     evidence of disease and is not on any specific therapy.  CT scan of     the abdomen and pelvis with IV contrast was carried out on     11/19/2010, showed no evidence for recurrence.  The patient had     endoscopic ultrasound by Dr. [name] on 08/14/2011, also without     evidence for recurrence. 2. Hypertension. 3. Diverticulosis. 4. Hypothyroidism.  MEDICATIONS: 1. Aspirin 81 mg daily. 2. Os-Cal 250-125 mg 1 daily. 3. Levothyroxine 75 mcg daily. 4. Lisinopril 20 mg daily. 5. Cozaar 100 mg daily. 6. Hytrin 10 mg at bedtime.  HISTORY:  I saw Cheryl Yoder today for followup of her gastric stromal tumor which involved the lesser curvature of the stomach.  The pathology was fairly bland with no cytotypic atypia, mitotic activity or necrosis. Tumor stage was T2.  It was felt the patient had an excellent prognosis with a cure rate after surgery of approximately 95%.  The patient did not require any adjuvant treatment.  Cheryl Yoder was last seen by [place] on 05/28/2012 at which time she was having some mild dysphagia.  For that reason, and the fact that her last endoscopic ultrasound was carried out on 04/05/2009, the patient was referred back to Dr. [name] who carried out endoscopic ultrasound on 08/14/2011, and this was negative.  The patient is here today for routine followup.  Denies any problems.  Feels entirely well and denies any residual dysphagia, abdominal pain, any sense of ill health, trouble eating, etc.  PHYSICAL EXAMINATION:  [name] is a youthful 78 year old.  She certainly looks well.  Weight is 154.7 pounds, height 5 feet 3 inches, body surface area 1.77 meters squared.  Blood pressure 142/69.  Other vital signs are normal.  There is no scleral icterus.  Mouth and pharynx are benign.  No peripheral adenopathy palpable.  No axillary or inguinal adenopathy.  Heart and lungs are normal.  Breasts were not examined. Abdomen somewhat full feeling particularly the upper abdomen but no definite organomegaly or masses.  Extremities no peripheral edema or clubbing.  Neurologic exam is normal.  LABORATORY DATA:  Today, white count 7.4, ANC 5.0, hemoglobin 12.3, hematocrit 36.2, platelets 243,000.  Chemistries today are pending. Chemistries from 05/29/2011 were normal.  IMAGING STUDIES: 1. Bone density scan from 07/26/2010 yielded a T-score of the left     femur of -2.1 and a T-score of the left forearm of -0.3.  There was     felt to be a statistically significant 3.9% decrease in the bone     mineral density of the total left hip as compared with the baseline     study done on 07/01/2006.  There was felt to be a statistically     significant 4.2% decrease in the bone mineral density of the left     forearm as compared with 07/17/2008.  According to the [facility] criteria     the patient qualifies as having osteopenia  with a T-score between -     1.0 and -2.5. 2. CT of abdomen and pelvis with IV contrast carried out on 11/19/2010     showed no evidence for metastatic disease in the abdomen and     pelvis.  There was a small hiatal hernia with suspicion for distal     esophagitis.  There was some possible urinary bladder wall     thickening which could be due to underdistention.  Cystitis was     felt to be less likely.  There was some slight decrease in the     increased density within the mesenteric fat, possibly related to     mesenteric panniculitis. 3. Digital screening mammogram from 04/24/2011 was  negative,  PROCEDURES:  Endoscopic ultrasound from 08/14/2011 showed a tortuous esophagus distally.  There was a focal Schatzki's like ring at the GE junction above a small hiatal hernia.  The ring was dilated with an 18 mm CRE TTS balloon held inflated for 60 seconds.  There was the expected minor mucosal tear and self limited bleeding.  There was evidence of the post wedge resection  anatomy to the stomach with no mucosal abnormalities, and normal duodenum.  Endoscopic ultrasound findings showed no evidence for recurrent tumor in the stomach, no perigastric adenopathy.  Limited views of the liver, pancreas and spleen were all normal.  IMPRESSION AND PLAN:  Cheryl Yoder continues to do well with no signs of recurrent GIST (gastrointestinal stromal tumor).  It is now 4 years from the time of diagnosis.  We will plan to see her again in 6 months at which time we will check CBC and chemistries.    ______________________________ Samul Dada, M.D. DSM/MEDQ  D:  12/01/2011  T:  12/01/2011  Job:  [id number]

## 2011-12-01 NOTE — Progress Notes (Signed)
This office note has been dictated.  #[id number]

## 2011-12-01 NOTE — Telephone Encounter (Signed)
appts made and printed for pt aom °

## 2012-02-20 ENCOUNTER — Other Ambulatory Visit: Payer: Self-pay | Admitting: Internal Medicine

## 2012-02-20 DIAGNOSIS — Z1231 Encounter for screening mammogram for malignant neoplasm of breast: Secondary | ICD-10-CM

## 2012-03-24 ENCOUNTER — Ambulatory Visit
Admission: RE | Admit: 2012-03-24 | Discharge: 2012-03-24 | Disposition: A | Discharge status: Home / Self Care | Payer: Medicare Other | Source: Ambulatory Visit | Attending: Internal Medicine | Admitting: Internal Medicine

## 2012-03-24 ENCOUNTER — Other Ambulatory Visit: Payer: Self-pay | Admitting: Internal Medicine

## 2012-03-24 DIAGNOSIS — R52 Pain, unspecified: Secondary | ICD-10-CM

## 2012-04-26 ENCOUNTER — Ambulatory Visit
Admission: RE | Admit: 2012-04-26 | Discharge: 2012-04-26 | Disposition: A | Discharge status: Home / Self Care | Payer: Medicare Other | Source: Ambulatory Visit | Attending: Internal Medicine | Admitting: Internal Medicine

## 2012-04-26 DIAGNOSIS — Z1231 Encounter for screening mammogram for malignant neoplasm of breast: Secondary | ICD-10-CM

## 2012-05-07 ENCOUNTER — Telehealth: Payer: Self-pay | Admitting: Oncology

## 2012-05-07 NOTE — Telephone Encounter (Signed)
Talked to patient and gave her new appt time for 06/01/12 lab and MD @ 4pm per Felicita Gage

## 2012-06-01 ENCOUNTER — Encounter: Payer: Self-pay | Admitting: Family

## 2012-06-01 ENCOUNTER — Ambulatory Visit (HOSPITAL_BASED_OUTPATIENT_CLINIC_OR_DEPARTMENT_OTHER): Payer: Medicare Other | Admitting: Family

## 2012-06-01 ENCOUNTER — Other Ambulatory Visit (HOSPITAL_BASED_OUTPATIENT_CLINIC_OR_DEPARTMENT_OTHER): Payer: Medicare Other | Admitting: Lab

## 2012-06-01 ENCOUNTER — Telehealth: Payer: Self-pay | Admitting: Oncology

## 2012-06-01 VITALS — BP 191/79 | HR 64 | Temp 97.5°F | Resp 20 | Ht 63.0 in | Wt 155.7 lb

## 2012-06-01 DIAGNOSIS — C49A0 Gastrointestinal stromal tumor, unspecified site: Secondary | ICD-10-CM

## 2012-06-01 DIAGNOSIS — M899 Disorder of bone, unspecified: Secondary | ICD-10-CM

## 2012-06-01 DIAGNOSIS — C165 Malignant neoplasm of lesser curvature of stomach, unspecified: Secondary | ICD-10-CM

## 2012-06-01 DIAGNOSIS — C494 Malignant neoplasm of connective and soft tissue of abdomen: Secondary | ICD-10-CM

## 2012-06-01 LAB — COMPREHENSIVE METABOLIC PANEL (CC13)
AST: 16 U/L (ref 5–34)
Albumin: 3.7 g/dL (ref 3.5–5.0)
Alkaline Phosphatase: 84 U/L (ref 40–150)
BUN: 19 mg/dL (ref 7.0–26.0)
Creatinine: 0.9 mg/dL (ref 0.6–1.1)
Potassium: 3.9 mEq/L (ref 3.5–5.1)
Total Bilirubin: 0.39 mg/dL (ref 0.20–1.20)

## 2012-06-01 LAB — CBC WITH DIFFERENTIAL/PLATELET
Eosinophils Absolute: 0.2 10*3/uL (ref 0.0–0.5)
HGB: 12.7 g/dL (ref 11.6–15.9)
LYMPH%: 25.1 % (ref 14.0–49.7)
MONO#: 0.4 10*3/uL (ref 0.1–0.9)
NEUT#: 4.5 10*3/uL (ref 1.5–6.5)
Platelets: 232 10*3/uL (ref 145–400)
RBC: 4.16 10*6/uL (ref 3.70–5.45)
RDW: 12.7 % (ref 11.2–14.5)
WBC: 6.8 10*3/uL (ref 3.9–10.3)

## 2012-06-01 NOTE — Telephone Encounter (Signed)
Gave pt appt for November 2014 lab and MD °

## 2012-06-01 NOTE — Patient Instructions (Addendum)
Please contact us if you have any questions or concerns. Please notify [name] today of you elevated blood pressure readings:  191/79 upper right arm, 212/87 lower right arm, 192/65 upper left arm.  Check blood pressure at home. Please notify Dr. Christella Hartigan if you have any difficulties with swallowing.

## 2012-06-01 NOTE — Progress Notes (Signed)
Patient ID: Cheryl Yoder, female   DOB: [birth year], 78 y.o.   MRN: [id number] CSN: [id number]  CC: [name], MD  Angelia Mould. Derrell Lolling, M.D.  Erskine Speed, M.D.  Problem List: MANJOT BEUMER is a 78 y.o. Caucasian female with a problem list consisting of:  1. Gastrointestinal stromal tumor of the lesser curvature measuring 7.5 cm with biopsy carried out on 12/30/2007 and definitive surgery  which was a complete resection on 03/28/2008. Margins were negative. There was no angiolymphatic or perineural invasion. C-KIT and CD34 were both positive. The patient remains without evidence of disease and is not on any specific therapy. CT scan of the abdomen and pelvis with IV contrast was carried out on 11/19/2010, showed no evidence for recurrence. The patient had endoscopic ultrasound by Dr. [name] on 08/14/2011, also without evidence for recurrence.  2. Hypertension 3. Diverticulosis  4. Hypothyroidism  Dr. Arline Asp and I saw Cheryl Yoder today for follow up of her gastric stromal tumor which involved the lesser curvature of the stomach. The pathology was fairly bland with no cytotypic atypia, mitotic activity or necrosis. Tumor stage was T2. It was felt the patient had an excellent prognosis with a cure rate after surgery of approximately 95%. The patient did not require any adjuvant treatment. Cheryl Yoder was last seen by [place] on 12/01/2011 at which time she was having some mild dysphagia. For that reason, and the fact that her last endoscopic ultrasound was carried out on 04/05/2009, the patient was referred back to Dr. [name] who carried out endoscopic ultrasound on 08/14/2011, and this was negative. The patient is here today for routine follow up.  She states that she was diagnosed with arthritis in her right hip.  She takes OTC Aleve which relieves her pain which she describes as intermittent, non-radiating and worse in the morning.  Cheryl Yoder states that her dysphagia has  improved, but she does have occasional difficulty swallowing chicken.  Dr. Arline Asp asked the patient to notify Dr. Christella Hartigan if she continues to have swallowing difficulty.  Cheryl Yoder's BP was elevated today with readings of 191/79, 212/87 and 192/65 respectively.  The patient states that she believes she has "white coat" syndrome and that she will have her daughter Annice Pih (a nurse) recheck her BP this evening at home.  I asked the patient to notify [name] today of her elevated blood pressure readings.  I notified Danielle, CMA at Dr. Thomasene Lot office of the patient's blood pressure readings.   Cheryl Yoder denies any other symptomatology today including abdominal pain, SOB, unusual bleeding, trouble eating, N/V/D or constipation, fever, chills or night sweats.   Past Medical History: Past Medical History  Diagnosis Date  . Cancer     gist  . GIST (gastrointestinal stromal tumor), malignant   . Hypertension   . Hypothyroid     Surgical History: Past Surgical History  Procedure Date  . Stomach surgery   . Abdominal hysterectomy   . Eus 08/14/2011    Procedure: UPPER ENDOSCOPIC ULTRASOUND (EUS) LINEAR;  Surgeon: Rob Bunting, MD;  Location: [facility] ENDOSCOPY;  Service: Endoscopy;  Laterality: N/A;  . Esophagogastroduodenoscopy 08/14/2011    Procedure: ESOPHAGOGASTRODUODENOSCOPY (EGD);  Surgeon: Rob Bunting, MD;  Location: [facility];  Service: Endoscopy;  Laterality: N/A;    Current Medications: Current Outpatient Prescriptions  Medication Sig Dispense Refill  . aspirin 81 MG chewable tablet Chew 81 mg by mouth daily.        . calcium-vitamin D (OSCAL)  250-125 MG-UNIT per tablet Take 1 tablet by mouth daily.        . carvedilol (COREG) 3.125 MG tablet Take 3.125 mg by mouth 2 (two) times daily with a meal.      . levothyroxine (SYNTHROID, LEVOTHROID) 75 MCG tablet Take 75 mcg by mouth daily.        Marland Kitchen losartan (COZAAR) 100 MG tablet Take 100 mg by mouth daily.        Marland Kitchen terazosin (HYTRIN)  10 MG capsule Take 10 mg by mouth at bedtime.          Allergies: Allergies  Allergen Reactions  . Codeine Sulfate   . Penicillins   . Sulfonamide Derivatives     Family History: No family history on file.  Social History: History  Substance Use Topics  . Smoking status: Never Smoker   . Smokeless tobacco: Never Used  . Alcohol Use: No    Review of Systems: 10 Point review of systems was completed and is negative except as noted above.   Physical Exam:   Blood pressure 191/79, pulse 64, temperature 97.5 F (36.4 C), temperature source Oral, resp. rate 20, height 5\' 3"  (1.6 m), weight 155 lb 11.2 oz (70.625 kg).  General appearance: Alert, cooperative, well nourished, no apparent distress Head: Normocephalic, without obvious abnormality, atraumatic Eyes: Conjunctivae/corneas clear, PERRLA, EOMI Nose: Nares, septum and mucosa are normal, no drainage or sinus tenderness Neck: No adenopathy, supple, symmetrical, trachea midline, thyroid not enlarged, no tenderness Resp: Clear to auscultation bilaterally Cardio: Regular rate and rhythm, S1, S2 normal, no murmur, click, rub or gallop GI: Soft, distended, non-tender, hypoactive bowel sounds, no organomegaly - difficult to assess entire abdomen as patient could not tolerate getting up on the exam table due to right hip pain. Extremities: Extremities normal, atraumatic, no cyanosis or edema except for RLE which had limited ROM Lymph nodes: Cervical, supraclavicular, and axillary nodes normal Neurologic: Grossly normal   Laboratory Data: Results for orders placed in visit on 06/01/12 (from the past 48 hour(s))  CBC WITH DIFFERENTIAL     Status: Normal   Collection Time   06/01/12  3:25 PM      Component Value Range Comment   WBC 6.8  3.9 - 10.3 10e3/uL    NEUT# 4.5  1.5 - 6.5 10e3/uL    HGB 12.7  11.6 - 15.9 g/dL    HCT 16.1  09.6 - 04.5 %    Platelets 232  145 - 400 10e3/uL    MCV 88.9  79.5 - 101.0 fL    MCH 30.5  25.1  - 34.0 pg    MCHC 34.3  31.5 - 36.0 g/dL    RBC 4.09  8.11 - 9.14 10e6/uL    RDW 12.7  11.2 - 14.5 %    lymph# 1.7  0.9 - 3.3 10e3/uL    MONO# 0.4  0.1 - 0.9 10e3/uL    Eosinophils Absolute 0.2  0.0 - 0.5 10e3/uL    Basophils Absolute 0.0  0.0 - 0.1 10e3/uL    NEUT% 65.6  38.4 - 76.8 %    LYMPH% 25.1  14.0 - 49.7 %    MONO% 6.0  0.0 - 14.0 %    EOS% 2.9  0.0 - 7.0 %    BASO% 0.4  0.0 - 2.0 %   LACTATE DEHYDROGENASE (CC13)     Status: Normal   Collection Time   06/01/12  3:25 PM      Component Value Range Comment  LDH 178  125 - 245 U/L 05/27/12 - NOTE new reference range.  COMPREHENSIVE METABOLIC PANEL (CC13)     Status: Abnormal   Collection Time   06/01/12  3:25 PM      Component Value Range Comment   Sodium 141  136 - 145 mEq/L    Potassium 3.9  3.5 - 5.1 mEq/L    Chloride 107  98 - 107 mEq/L    CO2 29  22 - 29 mEq/L    Glucose 110 (*) 70 - 99 mg/dl    BUN 56.2  7.0 - 13.0 mg/dL    Creatinine 0.9  0.6 - 1.1 mg/dL    Total Bilirubin 8.65  0.20 - 1.20 mg/dL    Alkaline Phosphatase 84  40 - 150 U/L    AST 16  5 - 34 U/L    ALT 13  0 - 55 U/L    Total Protein 7.1  6.4 - 8.3 g/dL    Albumin 3.7  3.5 - 5.0 g/dL    Calcium 9.9  8.4 - 78.4 mg/dL      Imaging Studies: 1. Bone density scan from 07/26/2010 yielded a T-score of the left femur of -2.1 and a T-score of the left forearm of -0.3. There was felt to be a statistically significant 3.9% decrease in the bone mineral density of the total left hip as compared with the baseline study done on 07/01/2006. There was felt to be a statistically significant 4.2% decrease in the bone mineral density of the left forearm as compared with 07/17/2008. According to the [facility] criteria the patient qualifies as having osteopenia with a T-score between - 1.0 and -2.5.  2. CT of abdomen and pelvis with IV contrast carried out on 11/19/2010 showed no evidence for metastatic disease in the abdomen and pelvis. There was a small hiatal hernia with  suspicion for distal esophagitis. There was some possible urinary bladder wall thickening which could be due to underdistention. Cystitis was felt to be less likely. 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  3. Digital screening mammogram from 04/24/2011 was negative. 4. Diagnostic x-ray of complete right hip on 03/24/2012 showed mild right hip osteoarthritis. 5. Digital screening mammogram on 04/26/2012 show no mammographic evidence of malignancy.   Procedures:  Endoscopic ultrasound from 08/14/2011 showed a tortuous esophagus distally. There was a focal Schatzki's like ring at the GE junction above a small hiatal hernia. The ring was dilated with an 18 mm CRE TTS balloon held inflated for 60 seconds. There was the expected minor mucosal tear and self limited bleeding. There was evidence of the post wedge resection anatomy to the stomach with no mucosal abnormalities, and normal duodenum. Endoscopic ultrasound findings showed no evidence for recurrent tumor in the stomach, no perigastric  adenopathy. Limited views of the liver, pancreas and spleen were all normal.    Impression/Plan: Cheryl Yoder continues to do well with no signs of recurrent GIST (gastrointestinal stromal tumor).  It is now over 4 years from the time of diagnosis on 12/30/2007.  Dr. Arline Asp gave the patient the option of continued 6 month follow-up or 12 month follow up with our office.  The patient opted for 12 month follow. We plan to see Cheryl Yoder again in 12 months on 05/31/2013,  at which time we will check CBC, CMP and LDH.  Cheryl Yoder is encouraged to contact us in the interim if she has any  questions or concerns.     Larina Bras, NP-C 06/01/2012, 3:22 PM

## 2012-11-17 ENCOUNTER — Encounter (HOSPITAL_COMMUNITY): Payer: Self-pay | Admitting: Pharmacy Technician

## 2012-11-23 ENCOUNTER — Other Ambulatory Visit (HOSPITAL_COMMUNITY): Payer: Self-pay | Admitting: Orthopedic Surgery

## 2012-11-23 NOTE — Progress Notes (Signed)
medeical clearance note dr Dorma Russell green on chart

## 2012-11-23 NOTE — Patient Instructions (Addendum)
20 BRITHANY WHITWORTH  11/23/2012   Your procedure is scheduled on: 5--15-2014  Report to [facility] at 530 AM.  Call this number if you have problems the morning of surgery [phone]   Remember:   Do not eat food or drink liquids :After Midnight.     Take these medicines the morning of surgery with A SIP OF WATER: carvedilol, levothryoxine                                SEE [place] PREPARING FOR SURGERY SHEET   Do not wear jewelry, make-up or nail polish.  Do not wear lotions, powders, or perfumes. You may wear deodorant.   Men may shave face and neck.  Do not bring valuables to the hospital.  Contacts, dentures or bridgework may not be worn into surgery.  Leave suitcase in the car. After surgery it may be brought to your room.  For patients admitted to the hospital, checkout time is 11:00 AM the day of discharge.   Patients discharged the day of surgery will not be allowed to drive home.  Name and phone number of your driver:  Special Instructions: N/A   Please read over the following fact sheets that you were given: MRSA Information., blood fact sheet, incentive spirometer fact sheet Call Cain Sieve RN pre op nurse if needed [phone]    FAILURE TO FOLLOW THESE INSTRUCTIONS MAY RESULT IN THE CANCELLATION OF YOUR SURGERY. PATIENT SIGNATURE___________________________________________

## 2012-11-24 ENCOUNTER — Encounter (HOSPITAL_COMMUNITY): Payer: Self-pay

## 2012-11-24 ENCOUNTER — Ambulatory Visit (HOSPITAL_COMMUNITY)
Admission: RE | Admit: 2012-11-24 | Discharge: 2012-11-24 | Disposition: A | Discharge status: Home / Self Care | Payer: Medicare Other | Source: Ambulatory Visit | Attending: Surgical | Admitting: Surgical

## 2012-11-24 ENCOUNTER — Encounter (HOSPITAL_COMMUNITY)
Admission: RE | Admit: 2012-11-24 | Discharge: 2012-11-24 | Disposition: A | Discharge status: Home / Self Care | Payer: Medicare Other | Source: Ambulatory Visit | Attending: Orthopedic Surgery | Admitting: Orthopedic Surgery

## 2012-11-24 DIAGNOSIS — I10 Essential (primary) hypertension: Secondary | ICD-10-CM | POA: Insufficient documentation

## 2012-11-24 DIAGNOSIS — Z01810 Encounter for preprocedural cardiovascular examination: Secondary | ICD-10-CM | POA: Insufficient documentation

## 2012-11-24 DIAGNOSIS — Z0183 Encounter for blood typing: Secondary | ICD-10-CM | POA: Insufficient documentation

## 2012-11-24 DIAGNOSIS — Z01818 Encounter for other preprocedural examination: Secondary | ICD-10-CM | POA: Insufficient documentation

## 2012-11-24 DIAGNOSIS — Z01812 Encounter for preprocedural laboratory examination: Secondary | ICD-10-CM | POA: Insufficient documentation

## 2012-11-24 LAB — CBC
Hemoglobin: 12.4 g/dL (ref 12.0–15.0)
RBC: 4.19 MIL/uL (ref 3.87–5.11)

## 2012-11-24 LAB — URINALYSIS, ROUTINE W REFLEX MICROSCOPIC
Bilirubin Urine: NEGATIVE
Glucose, UA: NEGATIVE mg/dL
Hgb urine dipstick: NEGATIVE
Ketones, ur: NEGATIVE mg/dL
Nitrite: NEGATIVE
Protein, ur: NEGATIVE mg/dL
Specific Gravity, Urine: 1.021 (ref 1.005–1.030)
Urobilinogen, UA: 0.2 mg/dL (ref 0.0–1.0)
pH: 6 (ref 5.0–8.0)

## 2012-11-24 LAB — APTT: aPTT: 31 seconds (ref 24–37)

## 2012-11-24 LAB — COMPREHENSIVE METABOLIC PANEL
ALT: 13 U/L (ref 0–35)
AST: 19 U/L (ref 0–37)
Albumin: 3.8 g/dL (ref 3.5–5.2)
Alkaline Phosphatase: 78 U/L (ref 39–117)
BUN: 15 mg/dL (ref 6–23)
CO2: 30 mEq/L (ref 19–32)
Calcium: 9.5 mg/dL (ref 8.4–10.5)
Chloride: 100 mEq/L (ref 96–112)
Creatinine, Ser: 0.8 mg/dL (ref 0.50–1.10)
GFR calc Af Amer: 77 mL/min — ABNORMAL LOW (ref >90)
GFR calc non Af Amer: 67 mL/min — ABNORMAL LOW (ref >90)
Glucose, Bld: 99 mg/dL (ref 70–99)
Potassium: 4.7 mEq/L (ref 3.5–5.1)
Sodium: 137 mEq/L (ref 135–145)
Total Bilirubin: 0.3 mg/dL (ref 0.3–1.2)
Total Protein: 7 g/dL (ref 6.0–8.3)

## 2012-11-24 LAB — ABO/RH: ABO/RH(D): A NEG

## 2012-11-24 LAB — SURGICAL PCR SCREEN
MRSA, PCR: NEGATIVE
Staphylococcus aureus: NEGATIVE

## 2012-11-24 LAB — PROTIME-INR
INR: 1.03 (ref 0.00–1.49)
Prothrombin Time: 13.4 seconds (ref 11.6–15.2)

## 2012-11-24 LAB — URINE MICROSCOPIC-ADD ON

## 2012-11-30 NOTE — H&P (Signed)
TOTAL HIP ADMISSION H&P  Patient is admitted for right total hip arthroplasty.  Subjective:  Chief Complaint: right hip pain  HPI: Cheryl Yoder, 79 y.o. female, has a history of pain and functional disability in the right hip(s) due to arthritis and patient has failed non-surgical conservative treatments for greater than 12 weeks to include NSAID's and/or analgesics, use of assistive devices and activity modification.  Onset of symptoms was gradual starting 1 year ago with gradually worsening course since that time.The patient noted no past surgery on the right hip(s).  Patient currently rates pain in the right hip at 7 out of 10 with activity. Patient has night pain, worsening of pain with activity and weight bearing, pain that interfers with activities of daily living, pain with passive range of motion and crepitus. Patient has evidence of subchondral cysts, periarticular osteophytes and joint space narrowing by imaging studies. This condition presents safety issues increasing the risk of falls.   There is no current active infection.  Patient Active Problem List   Diagnosis Date Noted  . Dysphagia 08/14/2011  . Esophageal stricture 08/14/2011  . GIST, malignant 05/29/2011   Past Medical History  Diagnosis Date  . GIST (gastrointestinal stromal tumor), malignant   . Hypertension   . Hypothyroid   . Cancer     gist    Past Surgical History  Procedure Laterality Date  . Stomach surgery  2006    gist tumor removed  . Eus  08/14/2011    Procedure: UPPER ENDOSCOPIC ULTRASOUND (EUS) LINEAR;  Surgeon: Rob Bunting, MD;  Location: [facility];  Service: Endoscopy;  Laterality: N/A;  . Esophagogastroduodenoscopy  08/14/2011    Procedure: ESOPHAGOGASTRODUODENOSCOPY (EGD);  Surgeon: Rob Bunting, MD;  Location: [facility];  Service: Endoscopy;  Laterality: N/A;  . Abdominal hysterectomy       Current outpatient prescriptions :aspirin 81 MG chewable tablet, Chew 81 mg by mouth daily.   , Disp: , Rfl: ;   calcium-vitamin D (OSCAL) 250-125 MG-UNIT per tablet, Take 1 tablet by mouth daily.  , Disp: , Rfl: ;   carvedilol (COREG) 6.25 MG tablet, Take 6.25 mg by mouth 2 (two) times daily with a meal., Disp: , Rfl: ;   doxazosin (CARDURA) 2 MG tablet, Take 2 mg by mouth at bedtime. Takes 1 total dose 3 mg, Disp: , Rfl:  levothyroxine (SYNTHROID, LEVOTHROID) 75 MCG tablet, Take 75 mcg by mouth every morning. , Disp: , Rfl: ;   losartan (COZAAR) 100 MG tablet, Take 100 mg by mouth at bedtime. , Disp: , Rfl: ;   polyethylene glycol (MIRALAX / GLYCOLAX) packet, Take 17 g by mouth daily., Disp: , Rfl:   Allergies  Allergen Reactions  . Codeine Sulfate Nausea And Vomiting  . Penicillins     unknown  . Sulfonamide Derivatives Rash    History  Substance Use Topics  . Smoking status: Never Smoker   . Smokeless tobacco: Never Used  . Alcohol Use: No    Family History  Problem Relation Age of Onset  . Diabetes Mother   . Rheum arthritis Mother   . Arthritis Sister   . Diabetes Son   . Arthritis Son   . Cancer Cousin      Review of Systems  Constitutional: Negative.   HENT: Negative.  Negative for neck pain.   Eyes: Negative.   Respiratory: Negative.   Cardiovascular: Negative.   Gastrointestinal: Negative.   Genitourinary: Negative.   Musculoskeletal: Positive for joint pain. Negative for myalgias,  back pain and falls.       Right hip pain  Skin: Negative.   Neurological: Negative.   Endo/Heme/Allergies: Negative.   Psychiatric/Behavioral: Negative.     Objective:  Physical Exam  Constitutional: She is oriented to person, place, and time. She appears well-developed and well-nourished.  HENT:  Head: Normocephalic and atraumatic.  Right Ear: External ear normal.  Left Ear: External ear normal.  Nose: Nose normal.  Mouth/Throat: Oropharynx is clear and moist.  Eyes: Conjunctivae and EOM are normal.  Neck: Normal range of motion. Neck supple. No tracheal  deviation present. No thyromegaly present.  Cardiovascular: Normal rate, regular rhythm, normal heart sounds and intact distal pulses.   No murmur heard. Respiratory: Effort normal and breath sounds normal. No respiratory distress. She has no wheezes. She exhibits no tenderness.  GI: Soft. Bowel sounds are normal. She exhibits no distension and no mass. There is no tenderness.  Musculoskeletal:       Right hip: She exhibits decreased range of motion, decreased strength and crepitus.       Left hip: Normal.       Right knee: Normal.       Left knee: Normal.       Right lower leg: She exhibits no tenderness and no swelling.       Left lower leg: She exhibits no tenderness and no swelling.  Lymphadenopathy:    She has no cervical adenopathy.  Neurological: She is alert and oriented to person, place, and time. She has normal strength and normal reflexes. No sensory deficit.  Skin: No rash noted. No erythema.  Psychiatric: She has a normal mood and affect. Her behavior is normal.      Vitals Pulse: 63 (Regular) BP: 152/77 (Sitting, Left Arm, Standard)  Estimated body mass index is 27.59 kg/(m^2) as calculated from the following:   Height as of 06/01/12: 5\' 3"  (1.6 m).   Weight as of 06/01/12: 70.625 kg (155 lb 11.2 oz).   Imaging Review Plain radiographs demonstrate severe degenerative joint disease of the right hip(s). The bone quality appears to be fair for age and reported activity level.  Assessment/Plan:  End stage arthritis, right hip(s)  The patient history, physical examination, clinical judgement of the provider and imaging studies are consistent with end stage degenerative joint disease of the right hip(s) and total hip arthroplasty is deemed medically necessary. The treatment options including medical management, injection therapy, arthroscopy and arthroplasty were discussed at length. The risks and benefits of total hip arthroplasty were presented and reviewed. The  risks due to aseptic loosening, infection, stiffness, dislocation/subluxation,  thromboembolic complications and other imponderables were discussed.  The patient acknowledged the explanation, agreed to proceed with the plan and consent was signed. Patient is being admitted for inpatient treatment for surgery, pain control, PT, OT, prophylactic antibiotics, VTE prophylaxis, progressive ambulation and ADL's and discharge planning.The patient is planning to be discharged home with home health services   [place], [place]

## 2012-12-02 ENCOUNTER — Encounter (HOSPITAL_COMMUNITY): Payer: Self-pay | Admitting: *Deleted

## 2012-12-02 ENCOUNTER — Encounter (HOSPITAL_COMMUNITY)
Admission: RE | Disposition: A | Discharge status: Home Health | Payer: Self-pay | Source: Ambulatory Visit | Attending: Orthopedic Surgery

## 2012-12-02 ENCOUNTER — Encounter (HOSPITAL_COMMUNITY): Payer: Self-pay | Admitting: Anesthesiology

## 2012-12-02 ENCOUNTER — Inpatient Hospital Stay (HOSPITAL_COMMUNITY): Payer: Medicare Other | Admitting: Anesthesiology

## 2012-12-02 ENCOUNTER — Inpatient Hospital Stay (HOSPITAL_COMMUNITY): Payer: Medicare Other

## 2012-12-02 ENCOUNTER — Inpatient Hospital Stay (HOSPITAL_COMMUNITY)
Admission: RE | Admit: 2012-12-02 | Discharge: 2012-12-05 | DRG: 470 | Disposition: A | Discharge status: Home Health | Payer: Medicare Other | Source: Ambulatory Visit | Attending: Orthopedic Surgery | Admitting: Orthopedic Surgery

## 2012-12-02 DIAGNOSIS — M1610 Unilateral primary osteoarthritis, unspecified hip: Principal | ICD-10-CM | POA: Diagnosis present

## 2012-12-02 DIAGNOSIS — M1611 Unilateral primary osteoarthritis, right hip: Secondary | ICD-10-CM | POA: Diagnosis present

## 2012-12-02 DIAGNOSIS — E039 Hypothyroidism, unspecified: Secondary | ICD-10-CM | POA: Diagnosis present

## 2012-12-02 DIAGNOSIS — D62 Acute posthemorrhagic anemia: Secondary | ICD-10-CM | POA: Diagnosis not present

## 2012-12-02 DIAGNOSIS — M169 Osteoarthritis of hip, unspecified: Principal | ICD-10-CM | POA: Diagnosis present

## 2012-12-02 DIAGNOSIS — I10 Essential (primary) hypertension: Secondary | ICD-10-CM | POA: Diagnosis present

## 2012-12-02 DIAGNOSIS — R112 Nausea with vomiting, unspecified: Secondary | ICD-10-CM | POA: Diagnosis not present

## 2012-12-02 HISTORY — PX: TOTAL HIP ARTHROPLASTY: SHX124

## 2012-12-02 LAB — TYPE AND SCREEN
ABO/RH(D): A NEG
Antibody Screen: NEGATIVE

## 2012-12-02 SURGERY — ARTHROPLASTY, HIP, TOTAL,POSTERIOR APPROACH
Anesthesia: General | Site: Hip | Laterality: Right | Wound class: Clean

## 2012-12-02 MED ORDER — ACETAMINOPHEN 325 MG PO TABS
650.0000 mg | ORAL_TABLET | Freq: Four times a day (QID) | ORAL | Status: DC | PRN
  Administered 2012-12-03 – 2012-12-05 (×3): 650 mg via ORAL
  Filled 2012-12-02 (×3): qty 2

## 2012-12-02 MED ORDER — LACTATED RINGERS IV SOLN: INTRAVENOUS | Status: DC

## 2012-12-02 MED ORDER — CELECOXIB 200 MG PO CAPS
200.0000 mg | ORAL_CAPSULE | Freq: Two times a day (BID) | ORAL | Status: DC
  Administered 2012-12-03 – 2012-12-05 (×5): 200 mg via ORAL
  Filled 2012-12-02 (×10): qty 1

## 2012-12-02 MED ORDER — METHOCARBAMOL 100 MG/ML IJ SOLN
500.0000 mg | Freq: Four times a day (QID) | INTRAVENOUS | Status: DC | PRN
  Administered 2012-12-02: 500 mg via INTRAVENOUS
  Filled 2012-12-02 (×2): qty 5

## 2012-12-02 MED ORDER — FERROUS SULFATE 325 (65 FE) MG PO TABS
325.0000 mg | ORAL_TABLET | Freq: Three times a day (TID) | ORAL | Status: DC
  Administered 2012-12-03 – 2012-12-05 (×6): 325 mg via ORAL
  Filled 2012-12-02 (×13): qty 1

## 2012-12-02 MED ORDER — STERILE WATER FOR IRRIGATION IR SOLN
Status: DC | PRN
  Administered 2012-12-02 (×2): 1500 mL

## 2012-12-02 MED ORDER — HYDROMORPHONE HCL PF 1 MG/ML IJ SOLN
INTRAMUSCULAR | Status: DC | PRN
  Administered 2012-12-02 (×2): 0.5 mg via INTRAVENOUS

## 2012-12-02 MED ORDER — LACTATED RINGERS IV SOLN
INTRAVENOUS | Status: DC | PRN
  Administered 2012-12-02 (×2): via INTRAVENOUS

## 2012-12-02 MED ORDER — LEVOTHYROXINE SODIUM 75 MCG PO TABS
75.0000 ug | ORAL_TABLET | Freq: Every day | ORAL | Status: DC
  Administered 2012-12-03 – 2012-12-05 (×3): 75 ug via ORAL
  Filled 2012-12-02 (×5): qty 1

## 2012-12-02 MED ORDER — METHOCARBAMOL 500 MG PO TABS
500.0000 mg | ORAL_TABLET | Freq: Four times a day (QID) | ORAL | Status: DC | PRN
  Administered 2012-12-03: 500 mg via ORAL
  Filled 2012-12-02: qty 1

## 2012-12-02 MED ORDER — PROPOFOL 10 MG/ML IV BOLUS
INTRAVENOUS | Status: DC | PRN
  Administered 2012-12-02: 100 mg via INTRAVENOUS

## 2012-12-02 MED ORDER — HYDROMORPHONE HCL 2 MG PO TABS: 2.0000 mg | ORAL_TABLET | ORAL | Status: AC | PRN

## 2012-12-02 MED ORDER — PHENOL 1.4 % MT LIQD: 1.0000 | OROMUCOSAL | Status: DC | PRN

## 2012-12-02 MED ORDER — CHLORHEXIDINE GLUCONATE 4 % EX LIQD
60.0000 mL | Freq: Once | CUTANEOUS | Status: DC
  Filled 2012-12-02: qty 60

## 2012-12-02 MED ORDER — POLYETHYLENE GLYCOL 3350 17 G PO PACK: 17.0000 g | PACK | Freq: Every day | ORAL | Status: DC | PRN

## 2012-12-02 MED ORDER — MENTHOL 3 MG MT LOZG: 1.0000 | LOZENGE | OROMUCOSAL | Status: DC | PRN

## 2012-12-02 MED ORDER — DOXAZOSIN MESYLATE 2 MG PO TABS
2.0000 mg | ORAL_TABLET | Freq: Every day | ORAL | Status: DC
  Administered 2012-12-02 – 2012-12-04 (×3): 2 mg via ORAL
  Filled 2012-12-02 (×5): qty 1

## 2012-12-02 MED ORDER — HYDROMORPHONE HCL PF 1 MG/ML IJ SOLN: 0.5000 mg | INTRAMUSCULAR | Status: DC | PRN

## 2012-12-02 MED ORDER — FENTANYL CITRATE 0.05 MG/ML IJ SOLN
INTRAMUSCULAR | Status: DC | PRN
  Administered 2012-12-02: 25 ug via INTRAVENOUS
  Administered 2012-12-02: 50 ug via INTRAVENOUS
  Administered 2012-12-02: 25 ug via INTRAVENOUS
  Administered 2012-12-02: 50 ug via INTRAVENOUS
  Administered 2012-12-02: 25 ug via INTRAVENOUS
  Administered 2012-12-02: 50 ug via INTRAVENOUS
  Administered 2012-12-02: 25 ug via INTRAVENOUS

## 2012-12-02 MED ORDER — ONDANSETRON HCL 4 MG/2ML IJ SOLN
INTRAMUSCULAR | Status: AC
  Filled 2012-12-02: qty 2

## 2012-12-02 MED ORDER — FENTANYL CITRATE 0.05 MG/ML IJ SOLN: 25.0000 ug | INTRAMUSCULAR | Status: DC | PRN

## 2012-12-02 MED ORDER — ONDANSETRON HCL 4 MG PO TABS: 4.0000 mg | ORAL_TABLET | Freq: Four times a day (QID) | ORAL | Status: DC | PRN

## 2012-12-02 MED ORDER — GLYCOPYRROLATE 0.2 MG/ML IJ SOLN
INTRAMUSCULAR | Status: DC | PRN
  Administered 2012-12-02: 0.4 mg via INTRAVENOUS
  Administered 2012-12-02: 0.1 mg via INTRAVENOUS
  Administered 2012-12-02: 0.2 mg via INTRAVENOUS

## 2012-12-02 MED ORDER — BUPIVACAINE LIPOSOME 1.3 % IJ SUSP
INTRAMUSCULAR | Status: DC | PRN
  Administered 2012-12-02: 20 mL

## 2012-12-02 MED ORDER — CISATRACURIUM BESYLATE (PF) 10 MG/5ML IV SOLN
INTRAVENOUS | Status: DC | PRN
  Administered 2012-12-02 (×2): 3 mg via INTRAVENOUS

## 2012-12-02 MED ORDER — BUPIVACAINE LIPOSOME 1.3 % IJ SUSP
20.0000 mL | Freq: Once | INTRAMUSCULAR | Status: DC
  Filled 2012-12-02: qty 20

## 2012-12-02 MED ORDER — NEOSTIGMINE METHYLSULFATE 1 MG/ML IJ SOLN
INTRAMUSCULAR | Status: DC | PRN
  Administered 2012-12-02: 3 mg via INTRAVENOUS

## 2012-12-02 MED ORDER — ALUM & MAG HYDROXIDE-SIMETH 200-200-20 MG/5ML PO SUSP: 30.0000 mL | ORAL | Status: DC | PRN

## 2012-12-02 MED ORDER — RIVAROXABAN 10 MG PO TABS
10.0000 mg | ORAL_TABLET | Freq: Every day | ORAL | Status: DC
  Administered 2012-12-03 – 2012-12-05 (×3): 10 mg via ORAL
  Filled 2012-12-02 (×5): qty 1

## 2012-12-02 MED ORDER — THROMBIN 5000 UNITS EX SOLR
CUTANEOUS | Status: DC | PRN
  Administered 2012-12-02: 5000 [IU] via TOPICAL

## 2012-12-02 MED ORDER — SODIUM CHLORIDE 0.9 % IJ SOLN
INTRAMUSCULAR | Status: DC | PRN
  Administered 2012-12-02: 20 mL

## 2012-12-02 MED ORDER — ACETAMINOPHEN 10 MG/ML IV SOLN
INTRAVENOUS | Status: DC | PRN
  Administered 2012-12-02: 1000 mg via INTRAVENOUS

## 2012-12-02 MED ORDER — PROMETHAZINE HCL 25 MG/ML IJ SOLN: 6.2500 mg | Freq: Four times a day (QID) | INTRAMUSCULAR | Status: DC | PRN

## 2012-12-02 MED ORDER — CARVEDILOL 6.25 MG PO TABS
6.2500 mg | ORAL_TABLET | Freq: Two times a day (BID) | ORAL | Status: DC
  Administered 2012-12-02 – 2012-12-05 (×6): 6.25 mg via ORAL
  Filled 2012-12-02 (×9): qty 1

## 2012-12-02 MED ORDER — BISACODYL 10 MG RE SUPP: 10.0000 mg | Freq: Every day | RECTAL | Status: DC | PRN

## 2012-12-02 MED ORDER — CLINDAMYCIN PHOSPHATE 600 MG/50ML IV SOLN
600.0000 mg | Freq: Four times a day (QID) | INTRAVENOUS | Status: AC
  Administered 2012-12-02 (×2): 600 mg via INTRAVENOUS
  Filled 2012-12-02 (×2): qty 50

## 2012-12-02 MED ORDER — EPHEDRINE SULFATE 50 MG/ML IJ SOLN
INTRAMUSCULAR | Status: DC | PRN
  Administered 2012-12-02 (×2): 10 mg via INTRAVENOUS

## 2012-12-02 MED ORDER — CLINDAMYCIN PHOSPHATE 900 MG/50ML IV SOLN
900.0000 mg | INTRAVENOUS | Status: AC
  Administered 2012-12-02: 900 mg via INTRAVENOUS
  Filled 2012-12-02: qty 50

## 2012-12-02 MED ORDER — SUCCINYLCHOLINE CHLORIDE 20 MG/ML IJ SOLN
INTRAMUSCULAR | Status: DC | PRN
  Administered 2012-12-02: 80 mg via INTRAVENOUS

## 2012-12-02 MED ORDER — ACETAMINOPHEN 650 MG RE SUPP: 650.0000 mg | Freq: Four times a day (QID) | RECTAL | Status: DC | PRN

## 2012-12-02 MED ORDER — ONDANSETRON HCL 4 MG/2ML IJ SOLN
4.0000 mg | Freq: Four times a day (QID) | INTRAMUSCULAR | Status: DC | PRN
  Administered 2012-12-02 (×2): 4 mg via INTRAVENOUS
  Filled 2012-12-02: qty 2

## 2012-12-02 MED ORDER — LACTATED RINGERS IV SOLN
INTRAVENOUS | Status: DC
  Administered 2012-12-02 – 2012-12-03 (×2): via INTRAVENOUS

## 2012-12-02 MED ORDER — LOSARTAN POTASSIUM 50 MG PO TABS
100.0000 mg | ORAL_TABLET | Freq: Every day | ORAL | Status: DC
  Administered 2012-12-02 – 2012-12-04 (×3): 100 mg via ORAL
  Filled 2012-12-02 (×5): qty 2

## 2012-12-02 MED ORDER — ONDANSETRON HCL 4 MG/2ML IJ SOLN
INTRAMUSCULAR | Status: DC | PRN
  Administered 2012-12-02 (×2): 2 mg via INTRAVENOUS

## 2012-12-02 MED ORDER — SODIUM CHLORIDE 0.9 % IR SOLN
Status: DC | PRN
  Administered 2012-12-02 (×2)

## 2012-12-02 MED ORDER — FLEET ENEMA 7-19 GM/118ML RE ENEM: 1.0000 | ENEMA | Freq: Once | RECTAL | Status: AC | PRN

## 2012-12-02 SURGICAL SUPPLY — 49 items
BAG SPEC THK2 15X12 ZIP CLS (MISCELLANEOUS) ×1
BAG ZIPLOCK 12X15 (MISCELLANEOUS) ×2 IMPLANT
BLADE SAW SAG 73X25 THK (BLADE) ×1
BLADE SAW SGTL 73X25 THK (BLADE) ×1 IMPLANT
CLOSURE STERI-STRIP 1/4X4 (GAUZE/BANDAGES/DRESSINGS) ×1 IMPLANT
CLOTH BEACON ORANGE TIMEOUT ST (SAFETY) ×2 IMPLANT
DRAPE INCISE IOBAN 66X45 STRL (DRAPES) ×2 IMPLANT
DRAPE INCISE IOBAN 85X60 (DRAPES) ×2 IMPLANT
DRAPE ORTHO SPLIT 77X108 STRL (DRAPES) ×4
DRAPE POUCH INSTRU U-SHP 10X18 (DRAPES) ×2 IMPLANT
DRAPE SURG 17X11 SM STRL (DRAPES) ×2 IMPLANT
DRAPE SURG ORHT 6 SPLT 77X108 (DRAPES) ×2 IMPLANT
DRAPE U-SHAPE 47X51 STRL (DRAPES) ×2 IMPLANT
DRSG EMULSION OIL 3X16 NADH (GAUZE/BANDAGES/DRESSINGS) ×2 IMPLANT
DRSG MEPILEX BORDER 4X12 (GAUZE/BANDAGES/DRESSINGS) ×1 IMPLANT
DURAPREP 26ML APPLICATOR (WOUND CARE) ×2 IMPLANT
ELECT BLADE TIP CTD 4 INCH (ELECTRODE) ×2 IMPLANT
ELECT REM PT RETURN 9FT ADLT (ELECTROSURGICAL) ×2
ELECTRODE REM PT RTRN 9FT ADLT (ELECTROSURGICAL) ×1 IMPLANT
EVACUATOR 1/8 PVC DRAIN (DRAIN) ×1 IMPLANT
FACESHIELD LNG OPTICON STERILE (SAFETY) ×10 IMPLANT
GLOVE BIOGEL M 6.5 STRL (GLOVE) ×3 IMPLANT
GLOVE BIOGEL PI IND STRL 8 (GLOVE) ×1 IMPLANT
GLOVE BIOGEL PI INDICATOR 8 (GLOVE) ×1
GLOVE ECLIPSE 8.0 STRL XLNG CF (GLOVE) ×6 IMPLANT
GOWN PREVENTION PLUS LG XLONG (DISPOSABLE) ×4 IMPLANT
GOWN STRL REIN XL XLG (GOWN DISPOSABLE) ×4 IMPLANT
IMMOBILIZER KNEE 20 (SOFTGOODS) ×4
IMMOBILIZER KNEE 20 THIGH 36 (SOFTGOODS) IMPLANT
KIT BASIN OR (CUSTOM PROCEDURE TRAY) ×2 IMPLANT
MANIFOLD NEPTUNE II (INSTRUMENTS) ×2 IMPLANT
NEEDLE HYPO 22GX1.5 SAFETY (NEEDLE) ×2 IMPLANT
PACK TOTAL JOINT (CUSTOM PROCEDURE TRAY) ×2 IMPLANT
POSITIONER SURGICAL ARM (MISCELLANEOUS) ×2 IMPLANT
SPONGE GAUZE 4X4 12PLY (GAUZE/BANDAGES/DRESSINGS) ×1 IMPLANT
SPONGE LAP 18X18 X RAY DECT (DISPOSABLE) ×3 IMPLANT
SPONGE SURGIFOAM ABS GEL 100 (HEMOSTASIS) ×2 IMPLANT
STAPLER VISISTAT 35W (STAPLE) ×2 IMPLANT
SUCTION FRAZIER TIP 10 FR DISP (SUCTIONS) ×2 IMPLANT
SUT VIC AB 0 CT1 27 (SUTURE) ×4
SUT VIC AB 0 CT1 27XBRD ANTBC (SUTURE) ×2 IMPLANT
SUT VIC AB 1 CT1 27 (SUTURE) ×8
SUT VIC AB 1 CT1 27XBRD ANTBC (SUTURE) ×5 IMPLANT
SUT VIC AB 2-0 CT1 27 (SUTURE) ×6
SUT VIC AB 2-0 CT1 27XBRD (SUTURE) IMPLANT
SYR 20CC LL (SYRINGE) ×2 IMPLANT
TOWEL OR 17X26 10 PK STRL BLUE (TOWEL DISPOSABLE) ×4 IMPLANT
TRAY FOLEY CATH 14FRSI W/METER (CATHETERS) ×2 IMPLANT
WATER STERILE IRR 1500ML POUR (IV SOLUTION) ×3 IMPLANT

## 2012-12-02 NOTE — Plan of Care (Signed)
Problem: Consults Goal: Diagnosis- Total Joint Replacement Right total hip     

## 2012-12-02 NOTE — Anesthesia Postprocedure Evaluation (Signed)
  Anesthesia Post-op Note  Patient: Cheryl Yoder  Procedure(s) Performed: Procedure(s) (LRB): RIGHT TOTAL HIP ARTHROPLASTY (Right)  Patient Location: PACU  Anesthesia Type: General  Level of Consciousness: awake and alert   Airway and Oxygen Therapy: Patient Spontanous Breathing  Post-op Pain: mild  Post-op Assessment: Post-op Vital signs reviewed, Patient's Cardiovascular Status Stable, Respiratory Function Stable, Patent Airway and No signs of Nausea or vomiting  Last Vitals:  Filed Vitals:   12/02/12 1135  BP: 138/89  Pulse: 63  Temp:   Resp: 16    Post-op Vital Signs: stable   Complications: No apparent anesthesia complications

## 2012-12-02 NOTE — Progress Notes (Signed)
Utilization review completed.  

## 2012-12-02 NOTE — Transfer of Care (Signed)
Immediate Anesthesia Transfer of Care Note  Patient: Cheryl Yoder  Procedure(s) Performed: Procedure(s): RIGHT TOTAL HIP ARTHROPLASTY (Right)  Patient Location: PACU  Anesthesia Type:General  Level of Consciousness: awake, alert  and oriented  Airway & Oxygen Therapy: Patient Spontanous Breathing and Patient connected to face mask oxygen  Post-op Assessment: Report given to PACU RN and Post -op Vital signs reviewed and stable  Post vital signs: stable  Complications: No apparent anesthesia complications

## 2012-12-02 NOTE — Anesthesia Preprocedure Evaluation (Addendum)
Anesthesia Evaluation  Patient identified by MRN, date of birth, ID band Patient awake  General Assessment Comment:.  GIST (gastrointestinal stromal tumor), malignant     .  Hypertension     .  Hypothyroid     .  Cancer         gist     Reviewed: Allergy & Precautions, H&P , NPO status , Patient's Chart, lab work & pertinent test results  Airway Mallampati: II TM Distance: >3 FB Neck ROM: Full    Dental  (+) Edentulous Upper and Edentulous Lower   Pulmonary neg pulmonary ROS,  CXR: NAD breath sounds clear to auscultation  Pulmonary exam normal       Cardiovascular Exercise Tolerance: Good hypertension, Pt. on medications and Pt. on home beta blockers negative cardio ROS  Rhythm:Regular Rate:Normal  ECG normal.   Neuro/Psych negative neurological ROS  negative psych ROS   GI/Hepatic negative GI ROS, Neg liver ROS,   Endo/Other  Hypothyroidism   Renal/GU negative Renal ROS  negative genitourinary   Musculoskeletal negative musculoskeletal ROS (+)   Abdominal   Peds negative pediatric ROS (+)  Hematology negative hematology ROS (+)   Anesthesia Other Findings   Reproductive/Obstetrics negative OB ROS                          Anesthesia Physical Anesthesia Plan  ASA: II  Anesthesia Plan: General   Post-op Pain Management:    Induction: Intravenous  Airway Management Planned: Oral ETT  Additional Equipment:   Intra-op Plan:   Post-operative Plan: Extubation in OR  Informed Consent: I have reviewed the patients History and Physical, chart, labs and discussed the procedure including the risks, benefits and alternatives for the proposed anesthesia with the patient or authorized representative who has indicated his/her understanding and acceptance.   Dental advisory given  Plan Discussed with: CRNA  Anesthesia Plan Comments: (Discussed general versus spinal. Patient prefers  general.)       Anesthesia Quick Evaluation

## 2012-12-02 NOTE — Interval H&P Note (Signed)
History and Physical Interval Note:  12/02/2012 7:04 AM  Cheryl Yoder  has presented today for surgery, with the diagnosis of OA RIGHT HIP  The various methods of treatment have been discussed with the patient and family. After consideration of risks, benefits and other options for treatment, the patient has consented to  Procedure(s): RIGHT TOTAL HIP ARTHROPLASTY (Right) as a surgical intervention .  The patient's history has been reviewed, patient examined, no change in status, stable for surgery.  I have reviewed the patient's chart and labs.  Questions were answered to the patient's satisfaction.     Soriya Worster A

## 2012-12-02 NOTE — Progress Notes (Signed)
CSW consulted for SNF placement. Met with pt's daughter at bedside. Pt sleeping at this time. D/c planning discussed with daughter. Pt plans to return home following hospital d/c . HH Services will be needed. Daughter states pt is not interested in SNF placement and she has good support at home. RNCM will assist with d/c planning. CSW is available if plan changes and SNF placement is needed.  Cori Razor LCSW [phone]

## 2012-12-02 NOTE — Brief Op Note (Signed)
12/02/2012  8:56 AM  PATIENT:  Cheryl Yoder  79 y.o. female  PRE-OPERATIVE DIAGNOSIS:  OA RIGHT HIP  POST-OPERATIVE DIAGNOSIS:  OA RIGHT HIP  PROCEDURE:  Procedure(s): RIGHT TOTAL HIP ARTHROPLASTY (Right)  SURGEON:  Surgeon(s) and Role:    * Jacki Cones, MD - Primary    * Shelda Pal, MD - Assisting  PHYSICIAN ASSISTANT:Amber [place] PA   ASSISTANTS: Durene Romans MD and Dimitri Ped PA  ANESTHESIA:   general  EBL:  Total I/O In: 0  Out: 350 [Urine:100; Blood:250]  BLOOD ADMINISTERED:none  DRAINS: none   LOCAL MEDICATIONS USED:  BUPIVICAINE 20cc mixed with 20cc of Normal Saline.  SPECIMEN:  No Specimen  DISPOSITION OF SPECIMEN:  N/A  COUNTS:  YES  TOURNIQUET:  * No tourniquets in log *  DICTATION: .Other Dictation: Dictation Number [id number]  PLAN OF CARE: Admit to inpatient   PATIENT DISPOSITION:  Stable in OR   Delay start of Pharmacological VTE agent (>24hrs) due to surgical blood loss or risk of bleeding: yes

## 2012-12-03 ENCOUNTER — Encounter (HOSPITAL_COMMUNITY): Payer: Self-pay | Admitting: Orthopedic Surgery

## 2012-12-03 DIAGNOSIS — D62 Acute posthemorrhagic anemia: Secondary | ICD-10-CM | POA: Diagnosis not present

## 2012-12-03 LAB — CBC
HCT: 25.5 % — ABNORMAL LOW (ref 36.0–46.0)
Hemoglobin: 8.4 g/dL — ABNORMAL LOW (ref 12.0–15.0)
RBC: 2.94 MIL/uL — ABNORMAL LOW (ref 3.87–5.11)
WBC: 6.8 10*3/uL (ref 4.0–10.5)

## 2012-12-03 LAB — BASIC METABOLIC PANEL
BUN: 13 mg/dL (ref 6–23)
CO2: 29 mEq/L (ref 19–32)
Chloride: 102 mEq/L (ref 96–112)
Glucose, Bld: 129 mg/dL — ABNORMAL HIGH (ref 70–99)
Potassium: 4 mEq/L (ref 3.5–5.1)

## 2012-12-03 LAB — HEMOGLOBIN AND HEMATOCRIT, BLOOD
HCT: 27.6 % — ABNORMAL LOW (ref 36.0–46.0)
HCT: 26.4 % — ABNORMAL LOW (ref 36.0–46.0)
Hemoglobin: 9.4 g/dL — ABNORMAL LOW (ref 12.0–15.0)
Hemoglobin: 8.8 g/dL — ABNORMAL LOW (ref 12.0–15.0)

## 2012-12-03 NOTE — Evaluation (Signed)
Physical Therapy Evaluation Patient Details Name: Cheryl Yoder MRN: [id number] DOB: 1927/09/10 Today's Date: 12/03/2012 Time: 4098-1191 PT Time Calculation (min): 36 min  PT Assessment / Plan / Recommendation Clinical Impression  79 yo female admitted 12/02/12 for RTHA-posterior. Pt. became dizzy with brief syncopal episode after assisted to [facility]. Pt did faint briefly. RN in room. Pt alert again and returned to bed. noted Hgb 8.4 BP after supine 167/67. Will try OOB again this PM. Pt has had only tylenol and robaxin.Pt plans to DC to home with spouse. Pt will benefit from PT while in acute care.    PT Assessment  Patient needs continued PT services    Follow Up Recommendations  Home health PT    Does the patient have the potential to tolerate intense rehabilitation      Barriers to Discharge        Equipment Recommendations  Rolling walker with 5" wheels (youth)    Recommendations for Other Services     Frequency 7X/week    Precautions / Restrictions Precautions Precautions: Posterior Hip Precaution Booklet Issued: Yes (comment) Precaution Comments: posted in room. Restrictions RLE Weight Bearing: Partial weight bearing   Pertinent Vitals/Pain States no pain.      Mobility  Bed Mobility Bed Mobility: Supine to Sit;Sit to Supine Supine to Sit: 3: Mod assist Sit to Supine: 1: +2 Total assist Sit to Supine: Patient Percentage: 10% Details for Bed Mobility Assistance: assistance to move RLE to edge, cues for precautions. Pt had become dizzy/syncope and was lethargic getting back into bed so took 2 persons to pivot to bed. Transfers Transfers: Sit to Stand;Stand Pivot Transfers;Stand to Sit Sit to Stand: 3: Mod assist;With upper extremity assist;From bed;From chair/3-in-1 Stand to Sit: 3: Mod assist;To chair/3-in-1;To bed Stand Pivot Transfers: 1: +2 Total assist Stand Pivot Transfers: Patient Percentage: 40% Details for Transfer Assistance: pt did well from bed to  [facility], cues for precautions, then +2 to get to bed due to syncope. Ambulation/Gait Ambulation/Gait Assistance: Not tested (comment)    Exercises     PT Diagnosis: Difficulty walking;Generalized weakness;Acute pain  PT Problem List: Decreased strength;Decreased activity tolerance;Decreased mobility;Decreased safety awareness;Decreased knowledge of precautions;Cardiopulmonary status limiting activity;Decreased knowledge of use of DME PT Treatment Interventions: DME instruction;Gait training;Functional mobility training;Therapeutic activities;Therapeutic exercise;Patient/family education   PT Goals Acute Rehab PT Goals PT Goal Formulation: With patient Time For Goal Achievement: 12/10/12 Potential to Achieve Goals: Good Pt will go Supine/Side to Sit: with supervision PT Goal: Supine/Side to Sit - Progress: Goal set today Pt will go Sit to Supine/Side: with supervision PT Goal: Sit to Supine/Side - Progress: Goal set today Pt will go Sit to Stand: with supervision PT Goal: Sit to Stand - Progress: Goal set today Pt will go Stand to Sit: with supervision PT Goal: Stand to Sit - Progress: Goal set today Pt will Ambulate: 51 - 150 feet;with rolling walker;with supervision PT Goal: Ambulate - Progress: Goal set today Pt will Perform Home Exercise Program: with supervision, verbal cues required/provided PT Goal: Perform Home Exercise Program - Progress: Goal set today Additional Goals Additional Goal #1: demo posterior hip precautions. PT Goal: Additional Goal #1 - Progress: Goal set today  Visit Information  Last PT Received On: 12/03/12 Assistance Needed: +2    Subjective Data  Subjective: I have been nauseated. Patient Stated Goal: to go home.   Prior Functioning  Home Living Lives With: Spouse Available Help at Discharge: Family Type of Home: House Home Access: Ramped entrance Home  Layout: One level Bathroom Toilet: Standard Home Adaptive Equipment: None Prior Function Level  of Independence: Independent Able to Take Stairs?: Yes    Cognition  Cognition Arousal/Alertness: Awake/alert Behavior During Therapy: WFL for tasks assessed/performed Overall Cognitive Status: Within Functional Limits for tasks assessed    Extremity/Trunk Assessment Right Lower Extremity Assessment RLE ROM/Strength/Tone: Deficits RLE ROM/Strength/Tone Deficits: R leg in internal rotation in bed., placed pillow between legs after return to bed. RLE Sensation: WFL - Light Touch Left Lower Extremity Assessment LLE ROM/Strength/Tone: Within functional levels Trunk Assessment Trunk Assessment: Normal   Balance    End of Session PT - End of Session Activity Tolerance: Treatment limited secondary to medical complications (Comment) Patient left: in bed;with call bell/phone within reach;with family/visitor present Nurse Communication: Mobility status (syncopal episode on bsc.)  GP     Rada Hay 12/03/2012, 12:12 PM Blanchard Kelch PT [phone]

## 2012-12-03 NOTE — Progress Notes (Signed)
Physical Therapy Treatment Patient Details Name: Cheryl Yoder MRN: [id number] DOB: 1928-06-06 Today's Date: 12/03/2012 Time: 4098-1191 PT Time Calculation (min): 25 min  PT Assessment / Plan / Recommendation Comments on Treatment Session  Pt was able to walk, did become pale and dizzy. BP supine=132/67, after ambulation=86/51.  continue to monitor BP while up.    Follow Up Recommendations  Home health PT     Does the patient have the potential to tolerate intense rehabilitation     Barriers to Discharge        Equipment Recommendations  Rolling walker with 5" wheels    Recommendations for Other Services    Frequency 7X/week   Plan Discharge plan remains appropriate;Frequency remains appropriate    Precautions / Restrictions Precautions Precautions: Posterior Hip Precaution Booklet Issued: Yes (comment) Precaution Comments: posted in room. Restrictions RLE Weight Bearing: Partial weight bearing   Pertinent Vitals/Pain BP drop when ambulating--see doc flow.   Mobility  Bed Mobility Bed Mobility: Supine to Sit;Sit to Supine Supine to Sit: 4: Min assist;With rails;HOB flat  Details for Bed Mobility Assistance: HOB elevated to max level for several minutes to accommodate BP. No dizziness. Transfers Transfers: Sit to Stand;Stand Pivot Transfers;Stand to Sit Sit to Stand: 3: Mod assist;With armrests;With upper extremity assist;From bed;From chair/3-in-1;4: Min assist Stand to Sit: 3: Mod assist;To chair/3-in-1;To bed Stand Pivot Transfers: 4: Min assist Stand Pivot Transfers: Patient Percentage: 40% Details for Transfer Assistance: cues for hand placement and leg placement, reaching back prior to sitting down/ push up with hands. Ambulation/Gait Ambulation/Gait Assistance: 1: +2 Total assist Ambulation/Gait: Patient Percentage: 90% Ambulation Distance (Feet): 45 Feet Assistive device: Rolling walker Ambulation/Gait Assistance Details: cues for PWB and for  sequence. Gait Pattern: Step-to pattern;Antalgic General Gait Details: cues   for precautions.    Exercises     PT Diagnosis: Difficulty walking;Generalized weakness;Acute pain  PT Problem List: Decreased strength;Decreased activity tolerance;Decreased mobility;Decreased safety awareness;Decreased knowledge of precautions;Cardiopulmonary status limiting activity;Decreased knowledge of use of DME PT Treatment Interventions: DME instruction;Gait training;Functional mobility training;Therapeutic activities;Therapeutic exercise;Patient/family education   PT Goals Acute Rehab PT Goals PT Goal Formulation: With patient Time For Goal Achievement: 12/10/12 Potential to Achieve Goals: Good Pt will go Supine/Side to Sit: with supervision PT Goal: Supine/Side to Sit - Progress: Progressing toward goal Pt will go Sit to Supine/Side: with supervision PT Goal: Sit to Supine/Side - Progress: Goal set today Pt will go Sit to Stand: with supervision PT Goal: Sit to Stand - Progress: Progressing toward goal Pt will go Stand to Sit: with supervision PT Goal: Stand to Sit - Progress: Progressing toward goal Pt will Ambulate: 51 - 150 feet;with rolling walker;with supervision PT Goal: Ambulate - Progress: Progressing toward goal Pt will Perform Home Exercise Program: with supervision, verbal cues required/provided PT Goal: Perform Home Exercise Program - Progress: Goal set today Additional Goals Additional Goal #1: demo posterior hip precautions. PT Goal: Additional Goal #1 - Progress: Progressing toward goal  Visit Information  Last PT Received On: 12/03/12 Assistance Needed: +2    Subjective Data  Subjective: Well, I walked. Patient Stated Goal: to go home.   Cognition  Cognition Arousal/Alertness: Awake/alert Behavior During Therapy: WFL for tasks assessed/performed Overall Cognitive Status: Within Functional Limits for tasks assessed    Balance     End of Session PT - End of  Session Activity Tolerance: Patient tolerated treatment well Patient left: in chair;with call bell/phone within reach;with family/visitor present Nurse Communication: Mobility status (drop in BP with  walking.)   GP     Rada Hay 12/03/2012, 3:13 PM Blanchard Kelch PT [phone]

## 2012-12-03 NOTE — Op Note (Signed)
NAMEAARIYANA, MANZ              ACCOUNT NO.:  [id number]  MEDICAL RECORD NO.:  [id number]  LOCATION:  1609                         FACILITY:  [address] [facility]  PHYSICIAN:  Georges Lynch. [place] Mckeough, M.D.DATE OF BIRTH:  07-19-1928  DATE OF PROCEDURE:  12/02/2012 DATE OF DISCHARGE:                              OPERATIVE REPORT   SURGEON:  Georges Lynch. Darrelyn Hillock, M.D.  ASSISTANT: 1. Madlyn Frankel. Charlann Boxer, M.D. 2. [place], [place]  PREOPERATIVE DIAGNOSIS:  Severe degenerative arthritis of the right hip.  POSTOPERATIVE DIAGNOSIS:  Severe degenerative arthritis of the right hip.  OPERATION:  Right total hip arthroplasty utilizing DePuy system.  We utilized a size 5 high offset Tri-Lock stem.  The cup was a 52 mm cup, pinnacle cup with 1 screw and a hole eliminator.  The insert was AltrX polyethylene insert, 36 mm inside diameter.  The ball was a size +1.5, 36 mm metal ball.  PROCEDURE IN DETAIL:  Under general anesthesia, routine orthopedic prep and draping of the right hip was carried out with the patient on left side right hip up.  The appropriate time-out was first carried out in the operating room.  Prior to that I marked the appropriate right leg in the holding area.  She had 900 mg of Cleocin preop.  At this time, a posterior lateral approach was carried out.  Bleeders were identified and cauterized.  I then split the iliotibial band which was extremely contracted.  I then had it split transversely.  Following that, I protected the sciatic nerve at all times.  I identified the external rotators.  I partially detached those.  Following that, I then went down and split the capsule and dislocated the head, amputated the femoral head at the appropriate neck length.  Following that, I then used a box osteotome to remove the cancellous bone from the trochanter.  I then used a widening reamer, then the canal finders, thoroughly irrigated out the canal packed the canal with a sponge, which was later  removed.  We then directed attention to the acetabulum.  We debrided the acetabulum, reamed the acetabulum up to a size 51 mm.  We did medialize the reamer because of the slight subluxation of the femoral head laterally. Following that, the permanent cup was inserted with a pinnacle cup with 3 holes.  We put the cup into appropriate angle.  We utilized a Charnley guide to make sure we had appropriate angle.  Following that, 1 drill hole was made in the acetabulum, 1 screw was used.  We then inserted a hole eliminator.  We thoroughly irrigated out the area and inserted our permanent AltrX cup.  At this time, a large sponge was removed the femoral canal.  We irrigated the canal.  We went through trials again and finally selected a 1.5 length ball size.  We then inserted our permanent metal ball after we inserted our permanent high offset Tri- Lock stem, which was a size 5.  We then cleared the acetabulum, reduced the hip and had excellent leg lengths.  Again, we did measure leg length with the trials prior to this.  We had good stability.  We thoroughly irrigated out the area.  I  closed the wound layers in the usual fashion. I injected a mixture of 20 mL of Exparel, 20 mL of normal saline. Sterile dressings were applied.          ______________________________ Georges Lynch Darrelyn Hillock, M.D.     RAG/MEDQ  D:  12/02/2012  T:  12/03/2012  Job:  [id number]

## 2012-12-03 NOTE — Progress Notes (Signed)
Physical Therapy Treatment Patient Details Name: Cheryl Yoder MRN: [id number] DOB: [birth year] Today's Date: 12/03/2012 Time: 2956-2130 PT Time Calculation (min): 15 min  PT Assessment / Plan / Recommendation Comments on Treatment Session  pt did not get dizzy this trip/ palns for DC sunday.    Follow Up Recommendations  Home health PT     Does the patient have the potential to tolerate intense rehabilitation     Barriers to Discharge        Equipment Recommendations  Rolling walker with 5" wheels    Recommendations for Other Services    Frequency 7X/week   Plan Discharge plan remains appropriate;Frequency remains appropriate    Precautions / Restrictions Precautions Precautions: Posterior Hip Precaution Booklet Issued: Yes (comment) Precaution Comments: posted in room. Restrictions RLE Weight Bearing: Partial weight bearing   Pertinent Vitals/Pain     Mobility  Bed Mobility Sit to Supine: 4: Min assist Details for Bed Mobility Assistance: supported RLE to get onto bed. Transfers Sit to Stand: 4: Min assist;From chair/3-in-1;With upper extremity assist Stand to Sit: 4: Min assist;To chair/3-in-1;With upper extremity assist Stand Pivot Transfers: 4: Min assist Details for Transfer Assistance: cues for hand placement and leg placement, reaching back prior to sitting down/ push up with hands. Ambulation/Gait Ambulation/Gait Assistance: 4: Min assist Ambulation/Gait: Patient Percentage: 90% Ambulation Distance (Feet): 20 Feet (x2) Assistive device: Rolling walker Ambulation/Gait Assistance Details: cues for PWB and for sequence. Gait Pattern: Step-to pattern;Antalgic General Gait Details: cues   for precautions.    Exercises     PT Diagnosis:    PT Problem List:   PT Treatment Interventions:     PT Goals Acute Rehab PT Goals Pt will go Supine/Side to Sit: with supervision PT Goal: Supine/Side to Sit - Progress: Progressing toward goal Pt will go Sit to  Stand: with supervision PT Goal: Sit to Stand - Progress: Progressing toward goal Pt will go Stand to Sit: with supervision PT Goal: Stand to Sit - Progress: Progressing toward goal Pt will Ambulate: 51 - 150 feet;with rolling walker;with supervision PT Goal: Ambulate - Progress: Progressing toward goal Additional Goals Additional Goal #1: demo posterior hip precautions. PT Goal: Additional Goal #1 - Progress: Progressing toward goal  Visit Information  Last PT Received On: 12/03/12 Assistance Needed: +2    Subjective Data  Subjective: I am ready to get up.   Cognition  Cognition Arousal/Alertness: Awake/alert Behavior During Therapy: WFL for tasks assessed/performed Overall Cognitive Status: Within Functional Limits for tasks assessed    Balance     End of Session PT - End of Session Activity Tolerance: Patient tolerated treatment well Patient left: in bed;with call bell/phone within reach;with family/visitor present Nurse Communication: Mobility status   GP     Rada Hay 12/03/2012, 4:14 PM

## 2012-12-03 NOTE — Care Management Note (Signed)
    Page 1 of 2   12/05/2012     4:09:31 PM   CARE MANAGEMENT NOTE 12/05/2012  Patient:  Cheryl Yoder, Cheryl Yoder   Account Number:  [id number]  Date Initiated:  12/03/2012  Documentation initiated by:  Colleen Can  Subjective/Objective Assessment:   dx osteoarthritis rt hip; total hip replacemnt     Action/Plan:   CM spoke wiuth patient. Plans are for patient to go bck to her home where she lives with daughter. States neice and daughter will be caregivers and son will also assist. She already has RW.   Anticipated DC Date:  12/05/2012   Anticipated DC Plan:  HOME W HOME HEALTH SERVICES  In-house referral  Clinical Social Worker      DC Planning Services  CM consult      [facility] Choice  HOME HEALTH   Choice offered to / List presented to:  C-1 Patient        HH arranged  HH-2 PT      Status of service:  Completed, signed off Medicare Important Message given?   (If response is "NO", the following Medicare IM given date fields will be blank) Date Medicare IM given:   Date Additional Medicare IM given:    Discharge Disposition:  HOME W HOME HEALTH SERVICES  Per UR Regulation:    If discussed at Long Length of Stay Meetings, dates discussed:    Comments:  12/05/12 Aime Meloche RN,BSN NCM [phone] GENTIVA DONNA(REP) ALREADY FOLLOWING FOR HHPT ORDERED, & D/C TODAY.  12/04/12 1347 Tymeeka Davis,RN,BSN [phone] Cm spoke with pt at bedside with daughter present. Per offered choice for [facility], per pt choice [facility] to provide [facility] services upon discharge. Cheryl Yoder made aware at [phone]. Pt states having RW & BSC for home DME use. Pt's adult daughter to assist in home care and provide tx home. No other needs identified. MD orders entered.    12/03/2012 Damaris Schooner RN CCM [phone] Pt was given a list of HH agencies. She wants to go over with her daUGHTER  before making a decision. CM to f/u.

## 2012-12-03 NOTE — Progress Notes (Signed)
   Subjective: 1 Day Post-Op Procedure(s) (LRB): RIGHT TOTAL HIP ARTHROPLASTY (Right) Patient reports pain as moderate.   Patient seen in rounds without Dr. Darrelyn Hillock. Patient is well, and has had no acute complaints or problems. She has some discomfort in her right thigh and groin but overall is mainly having issues with nausea from the pain medication. She did get some rest last night. She denies shortness of breath and chest pain.  We will start therapy today.  Plan is to go Home after hospital stay.  Objective: Vital signs in last 24 hours: Temp:  [97.1 F (36.2 C)-98.5 F (36.9 C)] 98.5 F (36.9 C) (05/16 0525) Pulse Rate:  [51-63] 63 (05/16 0525) Resp:  [14-20] 18 (05/16 0525) BP: (111-180)/(43-89) 113/62 mmHg (05/16 0525) SpO2:  [95 %-100 %] 100 % (05/16 0525) Weight:  [68.04 kg (150 lb)] 68.04 kg (150 lb) (05/15 1035)  Intake/Output from previous day:  Intake/Output Summary (Last 24 hours) at 12/03/12 0743 Last data filed at 12/03/12 0600  Gross per 24 hour  Intake 3536.67 ml  Output   1250 ml  Net 2286.67 ml    Intake/Output this shift:    Labs:  Recent Labs  12/03/12 0400  HGB 8.4*    Recent Labs  12/03/12 0400  WBC 6.8  RBC 2.94*  HCT 25.5*  PLT 193    Recent Labs  12/03/12 0400  NA 136  K 4.0  CL 102  CO2 29  BUN 13  CREATININE 0.83  GLUCOSE 129*  CALCIUM 8.4    EXAM General - Patient is Alert and Oriented Extremity - Neurologically intact Intact pulses distally Dorsiflexion/Plantar flexion intact No cellulitis present Compartment soft Dressing - scant drainage. Dressing changed Motor Function - intact, moving foot and toes well on exam.  Hemovac pulled without difficulty.  Past Medical History  Diagnosis Date  . GIST (gastrointestinal stromal tumor), malignant   . Hypertension   . Hypothyroid   . Cancer     gist    Assessment/Plan: 1 Day Post-Op Procedure(s) (LRB): RIGHT TOTAL HIP ARTHROPLASTY (Right) Active Problems:   Osteoarthritis of right hip Acute blood loss anemia  Estimated body mass index is 26.58 kg/(m^2) as calculated from the following:   Height as of this encounter: 5\' 3"  (1.6 m).   Weight as of this encounter: 68.04 kg (150 lb). Advance diet Up with therapy Discharge home with home health Sunday  DVT Prophylaxis - Xarelto PWB 50% D/C Knee Immobilizer Begin Therapy Hip Precautions  Will recheck Hgb this afternoon. Reinforce dressing PRN. Patient will try pain meds PO before IV to try to limit nausea.    Bernise Sylvain LAUREN 12/03/2012, 7:43 AM

## 2012-12-03 NOTE — Evaluation (Signed)
Occupational Therapy Evaluation Patient Details Name: JENNINGS STIRLING MRN: [id number] DOB: [birth year] Today's Date: 12/03/2012 Time: 4696-2952 OT Time Calculation (min): 23 min  OT Assessment / Plan / Recommendation Clinical Impression  This 79 year old female was admitted for R posterior THA.  She is posterior THPs and is PWB.  Pt will benefit from continued OT to increase independence with adls and safety/activity tolerance for bathroom transfers with min guard level goals overall.      OT Assessment  Patient needs continued OT Services    Follow Up Recommendations  Home health OT    Barriers to Discharge      Equipment Recommendations   (pt will borrow 3:1 commode)    Recommendations for Other Services    Frequency  Min 2X/week    Precautions / Restrictions Precautions Precautions: Posterior Hip Precaution Booklet Issued: Yes (comment) Precaution Comments: posted in room. Restrictions RLE Weight Bearing: Partial weight bearing   Pertinent Vitals/Pain Mostly sore--R hip.  Repositioned with ice.  See vitals flowsheets for BP's 86/51 after walking (in sitting)    ADL  Grooming: Set up;Wash/dry hands Where Assessed - Grooming: Unsupported sitting Upper Body Bathing: Set up Where Assessed - Upper Body Bathing: Unsupported sitting Lower Body Bathing: Minimal assistance (long sponge) Where Assessed - Lower Body Bathing: Supported sit to stand Upper Body Dressing: Minimal assistance (lines) Where Assessed - Upper Body Dressing: Unsupported sitting Lower Body Dressing: Moderate assistance (with AE) Where Assessed - Lower Body Dressing: Supported sit to Pharmacist, hospital: Minimal assistance Statistician Method: Surveyor, minerals: Materials engineer and Hygiene: Min guard Where Assessed - Engineer, mining and Hygiene: Standing Equipment Used: Rolling walker;Reacher Transfers/Ambulation Related to ADLs:  spt to 3:1 commode.  Walked to hall; pt did become orthostatic ADL Comments: Pt has reacher:  educated on using this for pants, but she did not practice this.  She will have 24/7 help initially.  She plans to have family assist with sock aid, and she has seen this before.      OT Diagnosis: Generalized weakness  OT Problem List: Cardiopulmonary status limiting activity;Decreased activity tolerance;Decreased knowledge of use of DME or AE;Decreased knowledge of precautions;Pain OT Treatment Interventions: Self-care/ADL training;DME and/or AE instruction;Patient/family education   OT Goals Acute Rehab OT Goals OT Goal Formulation: With patient Time For Goal Achievement: 12/10/12 Potential to Achieve Goals: Good ADL Goals Pt Will Perform Grooming: with supervision;Standing at sink;Supported ADL Goal: Grooming - Progress: Goal set today Pt Will Transfer to Toilet: with min assist;Ambulation;3-in-1;Maintaining hip precautions;Maintaining weight bearing status (min guard) ADL Goal: Statistician - Progress: Goal set today Pt Will Perform Toileting - Hygiene: with min assist;Standing at 3-in-1/toilet (min guard) ADL Goal: Toileting - Hygiene - Progress: Goal set today Pt Will Perform Tub/Shower Transfer: Shower transfer;with min assist;Ambulation;Shower seat with back;Maintaining hip precautions;Maintaining weight bearing status ADL Goal: Tub/Shower Transfer - Progress: Goal set today Miscellaneous OT Goals Miscellaneous OT Goal #1: pt will state 3/3 thps OT Goal: Miscellaneous Goal #1 - Progress: Goal set today  Visit Information  Last OT Received On: 12/03/12 Assistance Needed: +2 PT/OT Co-Evaluation/Treatment: Yes    Subjective Data  Subjective: I can borrow one of those (3:1 commode)   Prior Functioning     Home Living Lives With: Spouse Available Help at Discharge: Family Type of Home: House Home Access: Ramped entrance Home Layout: One level Bathroom Shower/Tub: Tub/shower  unit;Walk-in shower Bathroom Toilet: Handicapped height Home Adaptive Equipment: None  Additional Comments: will use daughters walk in shower.  Can borrow 3:1 commode Prior Function Level of Independence: Independent Able to Take Stairs?: Yes Communication Communication: No difficulties         Vision/Perception     Cognition  Cognition Arousal/Alertness: Awake/alert Behavior During Therapy: WFL for tasks assessed/performed Overall Cognitive Status: Within Functional Limits for tasks assessed    Extremity/Trunk Assessment Right Upper Extremity Assessment RUE ROM/Strength/Tone: [facility] for tasks assessed Left Upper Extremity Assessment LUE ROM/Strength/Tone: WFL for tasks assessed Right Lower Extremity Assessment RLE ROM/Strength/Tone: Deficits RLE ROM/Strength/Tone Deficits: R leg in internal rotation in bed., placed pillow between legs after return to bed. RLE Sensation: WFL - Light Touch Left Lower Extremity Assessment LLE ROM/Strength/Tone: Within functional levels Trunk Assessment Trunk Assessment: Normal     Mobility Bed Mobility Bed Mobility: Supine to Sit;Sit to Supine Supine to Sit: 4: Min assist;With rails;HOB flat Sit to Supine: 1: +2 Total assist Sit to Supine: Patient Percentage: 10% Details for Bed Mobility Assistance: assistance to move RLE to edge, cues for precautions. Pt had become dizzy/syncope and was llethargic getting back into bed so took 2 persons. Transfers Sit to Stand: 3: Mod assist;With armrests;With upper extremity assist;From bed;From chair/3-in-1;4: Min assist Stand to Sit: 3: Mod assist;To chair/3-in-1;To bed Details for Transfer Assistance: cues for hand placement and leg placement     Exercise     Balance     End of Session OT - End of Session Activity Tolerance: Patient limited by fatigue (BP) Patient left: in chair;with call bell/phone within reach Nurse Communication: Mobility status (BP; orthostatic)  GO      Abdalla Naramore 12/03/2012, 2:23 PM

## 2012-12-04 LAB — CBC
Hemoglobin: 7.8 g/dL — ABNORMAL LOW (ref 12.0–15.0)
Platelets: 182 10*3/uL (ref 150–400)
RBC: 2.73 MIL/uL — ABNORMAL LOW (ref 3.87–5.11)

## 2012-12-04 LAB — BASIC METABOLIC PANEL
CO2: 31 mEq/L (ref 19–32)
GFR calc non Af Amer: 75 mL/min — ABNORMAL LOW (ref >90)
Glucose, Bld: 100 mg/dL — ABNORMAL HIGH (ref 70–99)
Potassium: 3.6 mEq/L (ref 3.5–5.1)
Sodium: 138 mEq/L (ref 135–145)

## 2012-12-04 NOTE — Care Management Note (Signed)
Cm spoke with pt at bedside with daughter present. Per offered choice for [facility], per pt choice [facility]ieve [facility]lander to provide [facility] services upon discharge. [name] made aware at [phone]. Pt states having RW & BSC for home DME use. Pt's adult daughter to assist in home care and provide tx home. No other needs identified. MD orders entered.   Roxy Manns Idelle Reimann,RN,BSN [phone]

## 2012-12-04 NOTE — Progress Notes (Signed)
Subjective: 2 Days Post-Op Procedure(s) (LRB): RIGHT TOTAL HIP ARTHROPLASTY (Right) Patient reports pain as well controlled. Reports only soreness at op site. Passing Flatus. Denies SOB, CP, Or calf pain. Tolerating PO's well. Daughter is at the bedside.   Objective: Vital signs in last 24 hours: Temp:  [97.6 F (36.4 C)-98.6 F (37 C)] 98.4 F (36.9 C) (05/17 0524) Pulse Rate:  [63-69] 69 (05/17 0524) Resp:  [15-18] 16 (05/17 0524) BP: (86-167)/(51-73) 128/73 mmHg (05/17 0524) SpO2:  [95 %-99 %] 95 % (05/17 0524)  Intake/Output from previous day: 05/16 0701 - 05/17 0700 In: 1200 [P.O.:720; I.V.:480] Out: 600 [Urine:600] Intake/Output this shift: Total I/O In: 240 [P.O.:240] Out: -    Recent Labs  12/03/12 0400 12/03/12 1356 12/03/12 1456 12/04/12 0452  HGB 8.4* 9.4* 8.8* 7.8*    Recent Labs  12/03/12 0400  12/03/12 1456 12/04/12 0452  WBC 6.8  --   --  6.1  RBC 2.94*  --   --  2.73*  HCT 25.5*  < > 26.4* 23.9*  PLT 193  --   --  182  < > = values in this interval not displayed.  Recent Labs  12/03/12 0400 12/04/12 0452  NA 136 138  K 4.0 3.6  CL 102 104  CO2 29 31  BUN 13 10  CREATININE 0.83 0.79  GLUCOSE 129* 100*  CALCIUM 8.4 8.6   No results found for this basename: LABPT, INR,  in the last 72 hours  Right hip dressing changed. No spreading redness or incision drainage. Right LE neurovascularly intact. Calves soft and non-tender.  Assessment/Plan: 2 Days Post-Op Procedure(s) (LRB): RIGHT TOTAL HIP ARTHROPLASTY (Right) D/C Sunday. Dressing changed. Check CBC in AM.  Cheryl Yoder L 12/04/2012, 8:58 AM

## 2012-12-04 NOTE — Progress Notes (Signed)
Physical Therapy Treatment Patient Details Name: Cheryl Yoder MRN: [id number] DOB: Mar 25, 1928 Today's Date: 12/04/2012 Time: 4098-1191 PT Time Calculation (min): 15 min  PT Assessment / Plan / Recommendation Comments on Treatment Session  No signs/symptoms of decr. Hgb while ambulating. Pt has good family support and plans to DC to home if medically ready tomorrow.    Follow Up Recommendations  Home health PT     Does the patient have the potential to tolerate intense rehabilitation     Barriers to Discharge        Equipment Recommendations  Rolling walker with 5" wheels    Recommendations for Other Services    Frequency 7X/week   Plan Discharge plan remains appropriate    Precautions / Restrictions Precautions Precautions: Posterior Hip Precaution Comments: pt able to state 2/3 precautions. reviewed all with pt Restrictions RLE Weight Bearing: Partial weight bearing RLE Partial Weight Bearing Percentage or Pounds: 50   Pertinent Vitals/Pain No pain, just sore.    Mobility  Bed Mobility Details for Bed Mobility Assistance: at EOB when OT arrived Transfers Sit to Stand: 4: Min guard;With upper extremity assist;From bed;From chair/3-in-1 Stand to Sit: 4: Min assist;With upper extremity assist;To chair/3-in-1 Details for Transfer Assistance: min verbal cues for hand placement and R LE management. Ambulation/Gait Ambulation/Gait Assistance: 4: Min guard Ambulation Distance (Feet): 100 Feet Assistive device: Rolling walker Ambulation/Gait Assistance Details: cues for PWB sequence. General Gait Details: cues   for precautions.    Exercises     PT Diagnosis:    PT Problem List:   PT Treatment Interventions:     PT Goals Acute Rehab PT Goals Pt will go Sit to Stand: with supervision PT Goal: Sit to Stand - Progress: Progressing toward goal Pt will go Stand to Sit: with supervision PT Goal: Stand to Sit - Progress: Progressing toward goal Pt will Ambulate: 51 -  150 feet;with rolling walker;with supervision PT Goal: Ambulate - Progress: Progressing toward goal Additional Goals Additional Goal #1: demo posterior hip precautions. PT Goal: Additional Goal #1 - Progress: Progressing toward goal  Visit Information  Last PT Received On: 12/04/12 Assistance Needed: +1    Subjective Data  Subjective: I am doing well   Cognition  Cognition Arousal/Alertness: Awake/alert Behavior During Therapy: WFL for tasks assessed/performed Overall Cognitive Status: Within Functional Limits for tasks assessed    Balance  Balance Balance Assessed: Yes Dynamic Standing Balance Dynamic Standing - Level of Assistance: 4: Min assist (min guard)  End of Session PT - End of Session Activity Tolerance: Patient tolerated treatment well Patient left: with call bell/phone within reach;with family/visitor present;in chair Nurse Communication: Mobility status   GP     Rada Hay 12/04/2012, 3:08 PM Blanchard Kelch PT [phone]

## 2012-12-04 NOTE — Progress Notes (Signed)
Occupational Therapy Treatment Patient Details Name: Cheryl Yoder MRN: [id number] DOB: 08/01/1927 Today's Date: 12/04/2012 Time: 4098-1191 OT Time Calculation (min): 22 min  OT Assessment / Plan / Recommendation Comments on Treatment Session Pt tolerated toilet transfer into bathroom well. No complaint of dizziness. Family present Reviewed all precautions with pt and family.     Follow Up Recommendations  Home health OT;Supervision/Assistance - 24 hour    Barriers to Discharge       Equipment Recommendations   (will borrow 3in1)    Recommendations for Other Services    Frequency Min 2X/week   Plan Discharge plan remains appropriate    Precautions / Restrictions Precautions Precautions: Posterior Hip Precaution Comments: pt able to state 2/3 precautions. reviewed all with pt and family Restrictions RLE Weight Bearing: Partial weight bearing RLE Partial Weight Bearing Percentage or Pounds: 50        ADL  Toilet Transfer: Performed;Minimal assistance Toilet Transfer Method: Other (comment) (with walker into bathroom. min guard to stand) Acupuncturist: Raised toilet seat with arms (or 3-in-1 over toilet) Toileting - Clothing Manipulation and Hygiene: Simulated;Min guard to manage gown only--didn't practice hygiene. Where Assessed - Toileting Clothing Manipulation and Hygiene: Standing Equipment Used: Rolling walker ADL Comments: Pt states family will assist with LB ADL. Discussed shower transfer and using 3in1 as shower seat. Pt doing well up today. No complaint of dizziness. Min verbal cues for hand placement and R LE management during toilet transfer.    OT Diagnosis:    OT Problem List:   OT Treatment Interventions:     OT Goals ADL Goals ADL Goal: Toilet Transfer - Progress: Progressing toward goals  Visit Information  Last OT Received On: 12/04/12 Assistance Needed: +1    Subjective Data  Subjective: I just went into the bathroom Patient Stated  Goal: wants to do what she can for herself   Prior Functioning       Cognition  Cognition Arousal/Alertness: Awake/alert Behavior During Therapy: WFL for tasks assessed/performed Overall Cognitive Status: Within Functional Limits for tasks assessed    Mobility  Bed Mobility Details for Bed Mobility Assistance: at EOB when OT arrived Transfers Transfers: Sit to Stand;Stand to Sit Sit to Stand: 4: Min guard;With upper extremity assist;From bed;From chair/3-in-1 Stand to Sit: 4: Min assist;With upper extremity assist;To chair/3-in-1 Details for Transfer Assistance: min verbal cues for hand placement and R LE management.    Exercises      Balance Balance Balance Assessed: Yes Dynamic Standing Balance Dynamic Standing - Level of Assistance: 4: Min assist (min guard)   End of Session OT - End of Session Activity Tolerance: Patient tolerated treatment well Patient left: in chair;with call bell/phone within reach;with family/visitor present  GO     Lennox Laity [phone] 12/04/2012, 1:00 PM

## 2012-12-04 NOTE — Progress Notes (Signed)
Physical Therapy Treatment Patient Details Name: Cheryl Yoder MRN: [id number] DOB: 12/31/1927 Today's Date: 12/04/2012 Time: 8295-6213 PT Time Calculation (min): 31 min  PT Assessment / Plan / Recommendation Comments on Treatment Session  Pt continues to do very well, no dizziness. Plans for DC tomorrow. daughter present for review of exercises.    Follow Up Recommendations  Home health PT     Does the patient have the potential to tolerate intense rehabilitation     Barriers to Discharge        Equipment Recommendations  Rolling walker with 5" wheels;None recommended by PT    Recommendations for Other Services    Frequency 7X/week   Plan Discharge plan remains appropriate    Precautions / Restrictions Precautions Precautions: Posterior Hip Precaution Comments: pt able to state 2/3 precautions. reviewed all with pt Restrictions RLE Weight Bearing: Partial weight bearing RLE Partial Weight Bearing Percentage or Pounds: 50   Pertinent Vitals/Pain     Mobility  Bed Mobility Sit to Supine: 4: Min assist Details for Bed Mobility Assistance: support of RLE onto bed. Transfers Sit to Stand: 5: Supervision;From chair/3-in-1;With upper extremity assist Stand to Sit: To bed;With upper extremity assist;5: Supervision Details for Transfer Assistance: min verbal cues for hand placement and R LE management. Ambulation/Gait Ambulation/Gait Assistance: 5: Supervision Ambulation Distance (Feet): 200 Feet Assistive device: Rolling walker Ambulation/Gait Assistance Details: cues for PWB sequence. Gait Pattern: Step-through pattern General Gait Details: cues   for precautions.    Exercises Total Joint Exercises Quad Sets: AROM;Right;10 reps Short Arc Quad: AROM;Right;10 reps Heel Slides: AAROM;Right;10 reps Hip ABduction/ADduction: AAROM;Right;20 reps   PT Diagnosis:    PT Problem List:   PT Treatment Interventions:     PT Goals Acute Rehab PT Goals Pt will go Sit to  Supine/Side: with supervision PT Goal: Sit to Supine/Side - Progress: Progressing toward goal Pt will go Sit to Stand: with supervision PT Goal: Sit to Stand - Progress: Progressing toward goal Pt will go Stand to Sit: with supervision PT Goal: Stand to Sit - Progress: Progressing toward goal Pt will Ambulate: 51 - 150 feet;with rolling walker;with supervision PT Goal: Ambulate - Progress: Progressing toward goal Pt will Perform Home Exercise Program: with supervision, verbal cues required/provided PT Goal: Perform Home Exercise Program - Progress: Progressing toward goal Additional Goals Additional Goal #1: demo posterior hip precautions. PT Goal: Additional Goal #1 - Progress: Progressing toward goal  Visit Information  Last PT Received On: 12/04/12 Assistance Needed: +1    Subjective Data  Subjective: It feels good to just bend my knee.   Cognition  Cognition Arousal/Alertness: Awake/alert Behavior During Therapy: WFL for tasks assessed/performed Overall Cognitive Status: Within Functional Limits for tasks assessed    Balance  Balance Balance Assessed: Yes Dynamic Standing Balance Dynamic Standing - Level of Assistance: 4: Min assist (min guard)  End of Session PT - End of Session Activity Tolerance: Patient tolerated treatment well Patient left: with call bell/phone within reach;with family/visitor present;in bed Nurse Communication: Mobility status   GP     Rada Hay 12/04/2012, 4:42 PM

## 2012-12-04 NOTE — Plan of Care (Signed)
Problem: Phase III Progression Outcomes Goal: Anticoagulant follow-up in place Outcome: Not Applicable Date Met:  12/04/12 xarelto

## 2012-12-05 LAB — CBC
Hemoglobin: 8.1 g/dL — ABNORMAL LOW (ref 12.0–15.0)
MCH: 29 pg (ref 26.0–34.0)
MCV: 87.5 fL (ref 78.0–100.0)
Platelets: 202 10*3/uL (ref 150–400)
RBC: 2.79 MIL/uL — ABNORMAL LOW (ref 3.87–5.11)
WBC: 6 10*3/uL (ref 4.0–10.5)

## 2012-12-05 MED ORDER — HYDROMORPHONE HCL 2 MG PO TABS: 2.0000 mg | ORAL_TABLET | Freq: Four times a day (QID) | ORAL | Status: DC | PRN

## 2012-12-05 MED ORDER — METHOCARBAMOL 500 MG PO TABS: 500.0000 mg | ORAL_TABLET | Freq: Four times a day (QID) | ORAL | Status: DC | PRN

## 2012-12-05 MED ORDER — FERROUS SULFATE 325 (65 FE) MG PO TABS: 325.0000 mg | ORAL_TABLET | Freq: Three times a day (TID) | ORAL | Status: DC

## 2012-12-05 MED ORDER — RIVAROXABAN 10 MG PO TABS: 10.0000 mg | ORAL_TABLET | Freq: Every day | ORAL | Status: DC

## 2012-12-05 MED ORDER — TRAMADOL HCL 50 MG PO TABS: 50.0000 mg | ORAL_TABLET | Freq: Four times a day (QID) | ORAL | Status: DC | PRN

## 2012-12-05 NOTE — Progress Notes (Signed)
Subjective: 3 Days Post-Op Procedure(s) (LRB): RIGHT TOTAL HIP ARTHROPLASTY (Right) Patient reports pain as 1 on 0-10 scale. Hbg stable. Will Dc on Iron .   Objective: Vital signs in last 24 hours: Temp:  [98.7 F (37.1 C)-99 F (37.2 C)] 99 F (37.2 C) (05/18 0446) Pulse Rate:  [60-66] 60 (05/18 0446) Resp:  [16-20] 16 (05/18 0446) BP: (117-154)/(55-73) 154/55 mmHg (05/18 0446) SpO2:  [95 %-98 %] 98 % (05/18 0446)  Intake/Output from previous day: 05/17 0701 - 05/18 0700 In: 940 [P.O.:540; I.V.:400] Out: -  Intake/Output this shift:     Recent Labs  12/03/12 0400 12/03/12 1356 12/03/12 1456 12/04/12 0452 12/05/12 0455  HGB 8.4* 9.4* 8.8* 7.8* 8.1*    Recent Labs  12/04/12 0452 12/05/12 0455  WBC 6.1 6.0  RBC 2.73* 2.79*  HCT 23.9* 24.4*  PLT 182 202    Recent Labs  12/03/12 0400 12/04/12 0452  NA 136 138  K 4.0 3.6  CL 102 104  CO2 29 31  BUN 13 10  CREATININE 0.83 0.79  GLUCOSE 129* 100*  CALCIUM 8.4 8.6   No results found for this basename: LABPT, INR,  in the last 72 hours  No cellulitis present  Assessment/Plan: 3 Days Post-Op Procedure(s) (LRB): RIGHT TOTAL HIP ARTHROPLASTY (Right) Discharge home with home health  Cheryl Yoder A 12/05/2012, 8:01 AM

## 2012-12-05 NOTE — Progress Notes (Signed)
Discharged from floor via w/c, daughter with pt. No changes in assessment.  Cheryl Yoder  

## 2012-12-05 NOTE — Progress Notes (Signed)
Physical Therapy Treatment Patient Details Name: Cheryl Yoder MRN: [id number] DOB: 1928-02-19 Today's Date: 12/05/2012 Time: 4098-1191 PT Time Calculation (min): 26 min  PT Assessment / Plan / Recommendation Comments on Treatment Session  pt is ready for DC. family/pt instructions completed.    Follow Up Recommendations  Home health PT     Does the patient have the potential to tolerate intense rehabilitation     Barriers to Discharge        Equipment Recommendations       Recommendations for Other Services    Frequency     Plan      Precautions / Restrictions Precautions Precautions: Posterior Hip Precaution Booklet Issued: Yes (comment) Precaution Comments: pt able to state 2/3 precautions. reviewed all with pt Restrictions Weight Bearing Restrictions: Yes RLE Weight Bearing: Weight bearing as tolerated RLE Partial Weight Bearing Percentage or Pounds: DR . G told pt she could WBAT.   Pertinent Vitals/Pain No pain    Mobility  Bed Mobility Supine to Sit: 5: Supervision Transfers Sit to Stand: 5: Supervision;From chair/3-in-1;With upper extremity assist;From bed Stand to Sit: With upper extremity assist;5: Supervision;To chair/3-in-1 Details for Transfer Assistance:  verbal cues for hand placement and R LE management. Ambulation/Gait Ambulation/Gait Assistance: 5: Supervision Ambulation Distance (Feet): 300 Feet Assistive device: Rolling walker Ambulation/Gait Assistance Details: sequence and R leg position in ER Gait Pattern: Step-through pattern General Gait Details: cues   for precautions.    Exercises Total Joint Exercises Quad Sets: AROM;Right;10 reps Short Arc Quad: AROM;Right;10 reps Heel Slides: AAROM;Right;10 reps Hip ABduction/ADduction: AAROM;Right;20 reps   PT Diagnosis:    PT Problem List:   PT Treatment Interventions:     PT Goals Acute Rehab PT Goals Pt will go Supine/Side to Sit: with supervision PT Goal: Supine/Side to Sit -  Progress: Met Pt will go Sit to Stand: with supervision PT Goal: Sit to Stand - Progress: Met Pt will go Stand to Sit: with supervision PT Goal: Stand to Sit - Progress: Met Pt will Ambulate: 51 - 150 feet;with rolling walker;with supervision PT Goal: Ambulate - Progress: Met Pt will Perform Home Exercise Program: with supervision, verbal cues required/provided PT Goal: Perform Home Exercise Program - Progress: Met Additional Goals Additional Goal #1: demo posterior hip precautions. PT Goal: Additional Goal #1 - Progress: Met  Visit Information  Last PT Received On: 12/05/12 Assistance Needed: +1    Subjective Data  Subjective: It feels good to just bend my knee.   Cognition  Cognition Arousal/Alertness: Awake/alert    Balance     End of Session PT - End of Session Activity Tolerance: Patient tolerated treatment well Patient left: in chair;with call bell/phone within reach;with family/visitor present Nurse Communication: Mobility status   GP     Rada Hay 12/05/2012, 11:28 AM

## 2012-12-06 NOTE — Discharge Summary (Signed)
Physician Discharge Summary   Patient ID: Cheryl Yoder MRN: [id number] DOB/AGE: 18-Sep-1927 79 y.o.  Admit date: 12/02/2012 Discharge date: 12/05/2012  Primary Diagnosis: Osteoarthritis, right hip  Admission Diagnoses:  Past Medical History  Diagnosis Date  . GIST (gastrointestinal stromal tumor), malignant   . Hypertension   . Hypothyroid   . Cancer     gist   Discharge Diagnoses:   Active Problems:   Osteoarthritis of right hip   Postoperative anemia due to acute blood loss  Estimated body mass index is 26.58 kg/(m^2) as calculated from the following:   Height as of this encounter: 5\' 3"  (1.6 m).   Weight as of this encounter: 68.04 kg (150 lb).  Procedure(s) (LRB): RIGHT TOTAL HIP ARTHROPLASTY (Right)   Consults: None  HPI: Cheryl Yoder, 79 y.o. female, has a history of pain and functional disability in the right hip(s) due to arthritis and patient has failed non-surgical conservative treatments for greater than 12 weeks to include NSAID's and/or analgesics, use of assistive devices and activity modification. Onset of symptoms was gradual starting 1 year ago with gradually worsening course since that time.The patient noted no past surgery on the right hip(s). Patient currently rates pain in the right hip at 7 out of 10 with activity. Patient has night pain, worsening of pain with activity and weight bearing, pain that interfers with activities of daily living, pain with passive range of motion and crepitus. Patient has evidence of subchondral cysts, periarticular osteophytes and joint space narrowing by imaging studies. This condition presents safety issues increasing the risk of falls. There is no current active infection.   Laboratory Data: Admission on 12/02/2012, Discharged on 12/05/2012  Component Date Value Range Status  . WBC 12/03/2012 6.8  4.0 - 10.5 K/uL Final  . RBC 12/03/2012 2.94* 3.87 - 5.11 MIL/uL Final  . Hemoglobin 12/03/2012 8.4* 12.0 - 15.0 g/dL  Final  . HCT 40/98/1191 25.5* 36.0 - 46.0 % Final  . MCV 12/03/2012 86.7  78.0 - 100.0 fL Final  . MCH 12/03/2012 28.6  26.0 - 34.0 pg Final  . MCHC 12/03/2012 32.9  30.0 - 36.0 g/dL Final  . RDW 47/82/9562 12.7  11.5 - 15.5 % Final  . Platelets 12/03/2012 193  150 - 400 K/uL Final  . Sodium 12/03/2012 136  135 - 145 mEq/L Final  . Potassium 12/03/2012 4.0  3.5 - 5.1 mEq/L Final  . Chloride 12/03/2012 102  96 - 112 mEq/L Final  . CO2 12/03/2012 29  19 - 32 mEq/L Final  . Glucose, Bld 12/03/2012 129* 70 - 99 mg/dL Final  . BUN 13/02/6577 13  6 - 23 mg/dL Final  . Creatinine, Ser 12/03/2012 0.83  0.50 - 1.10 mg/dL Final  . Calcium 46/96/2952 8.4  8.4 - 10.5 mg/dL Final  . GFR calc non Af Amer 12/03/2012 64* >90 mL/min Final  . GFR calc Af Amer 12/03/2012 74* >90 mL/min Final   Comment:                                 The eGFR has been calculated                          using the CKD EPI equation.                          This  calculation has not been                          validated in all clinical                          situations.                          eGFR's persistently                          <90 mL/min signify                          possible Chronic Kidney Disease.  Marland Kitchen Hemoglobin 12/03/2012 9.4* 12.0 - 15.0 g/dL Final  . HCT 91/47/8295 27.6* 36.0 - 46.0 % Final  . Hemoglobin 12/03/2012 8.8* 12.0 - 15.0 g/dL Final  . HCT 62/13/0865 26.4* 36.0 - 46.0 % Final  . WBC 12/04/2012 6.1  4.0 - 10.5 K/uL Final  . RBC 12/04/2012 2.73* 3.87 - 5.11 MIL/uL Final  . Hemoglobin 12/04/2012 7.8* 12.0 - 15.0 g/dL Final  . HCT 78/46/9629 23.9* 36.0 - 46.0 % Final  . MCV 12/04/2012 87.5  78.0 - 100.0 fL Final  . MCH 12/04/2012 28.6  26.0 - 34.0 pg Final  . MCHC 12/04/2012 32.6  30.0 - 36.0 g/dL Final  . RDW 52/84/1324 12.8  11.5 - 15.5 % Final  . Platelets 12/04/2012 182  150 - 400 K/uL Final  . Sodium 12/04/2012 138  135 - 145 mEq/L Final  . Potassium 12/04/2012 3.6  3.5 - 5.1 mEq/L  Final  . Chloride 12/04/2012 104  96 - 112 mEq/L Final  . CO2 12/04/2012 31  19 - 32 mEq/L Final  . Glucose, Bld 12/04/2012 100* 70 - 99 mg/dL Final  . BUN 40/04/2724 10  6 - 23 mg/dL Final  . Creatinine, Ser 12/04/2012 0.79  0.50 - 1.10 mg/dL Final  . Calcium 36/64/4034 8.6  8.4 - 10.5 mg/dL Final  . GFR calc non Af Amer 12/04/2012 75* >90 mL/min Final  . GFR calc Af Amer 12/04/2012 87* >90 mL/min Final   Comment:                                 The eGFR has been calculated                          using the CKD EPI equation.                          This calculation has not been                          validated in all clinical                          situations.                          eGFR's persistently                          <90  mL/min signify                          possible Chronic Kidney Disease.  . WBC 12/05/2012 6.0  4.0 - 10.5 K/uL Final  . RBC 12/05/2012 2.79* 3.87 - 5.11 MIL/uL Final  . Hemoglobin 12/05/2012 8.1* 12.0 - 15.0 g/dL Final  . HCT 30/86/5784 24.4* 36.0 - 46.0 % Final  . MCV 12/05/2012 87.5  78.0 - 100.0 fL Final  . MCH 12/05/2012 29.0  26.0 - 34.0 pg Final  . MCHC 12/05/2012 33.2  30.0 - 36.0 g/dL Final  . RDW 69/62/9528 12.8  11.5 - 15.5 % Final  . Platelets 12/05/2012 202  150 - 400 K/uL Final  Hospital Outpatient Visit on 11/24/2012  Component Date Value Range Status  . aPTT 11/24/2012 31  24 - 37 seconds Final  . Sodium 11/24/2012 137  135 - 145 mEq/L Final  . Potassium 11/24/2012 4.7  3.5 - 5.1 mEq/L Final  . Chloride 11/24/2012 100  96 - 112 mEq/L Final  . CO2 11/24/2012 30  19 - 32 mEq/L Final  . Glucose, Bld 11/24/2012 99  70 - 99 mg/dL Final  . BUN 41/32/4401 15  6 - 23 mg/dL Final  . Creatinine, Ser 11/24/2012 0.80  0.50 - 1.10 mg/dL Final  . Calcium 02/72/5366 9.5  8.4 - 10.5 mg/dL Final  . Total Protein 11/24/2012 7.0  6.0 - 8.3 g/dL Final  . Albumin 44/09/4740 3.8  3.5 - 5.2 g/dL Final  . AST 59/56/3875 19  0 - 37 U/L Final  . ALT  11/24/2012 13  0 - 35 U/L Final  . Alkaline Phosphatase 11/24/2012 78  39 - 117 U/L Final  . Total Bilirubin 11/24/2012 0.3  0.3 - 1.2 mg/dL Final  . GFR calc non Af Amer 11/24/2012 67* >90 mL/min Final  . GFR calc Af Amer 11/24/2012 77* >90 mL/min Final   Comment:                                 The eGFR has been calculated                          using the CKD EPI equation.                          This calculation has not been                          validated in all clinical                          situations.                          eGFR's persistently                          <90 mL/min signify                          possible Chronic Kidney Disease.  Marland Kitchen Prothrombin Time 11/24/2012 13.4  11.6 - 15.2 seconds Final  . INR 11/24/2012 1.03  0.00 - 1.49 Final  . ABO/RH(D) 11/24/2012 A NEG  Final  . Antibody Screen 11/24/2012 NEG   Final  . Sample Expiration 11/24/2012 12/05/2012   Final  . Color, Urine 11/24/2012 YELLOW  YELLOW Final  . APPearance 11/24/2012 CLEAR  CLEAR Final  . Specific Gravity, Urine 11/24/2012 1.021  1.005 - 1.030 Final  . pH 11/24/2012 6.0  5.0 - 8.0 Final  . Glucose, UA 11/24/2012 NEGATIVE  NEGATIVE mg/dL Final  . Hgb urine dipstick 11/24/2012 NEGATIVE  NEGATIVE Final  . Bilirubin Urine 11/24/2012 NEGATIVE  NEGATIVE Final  . Ketones, ur 11/24/2012 NEGATIVE  NEGATIVE mg/dL Final  . Protein, ur 16/04/9603 NEGATIVE  NEGATIVE mg/dL Final  . Urobilinogen, UA 11/24/2012 0.2  0.0 - 1.0 mg/dL Final  . Nitrite 54/03/8118 NEGATIVE  NEGATIVE Final  . Leukocytes, UA 11/24/2012 MODERATE* NEGATIVE Final  . WBC 11/24/2012 7.0  4.0 - 10.5 K/uL Final  . RBC 11/24/2012 4.19  3.87 - 5.11 MIL/uL Final  . Hemoglobin 11/24/2012 12.4  12.0 - 15.0 g/dL Final  . HCT 14/78/2956 37.1  36.0 - 46.0 % Final  . MCV 11/24/2012 88.5  78.0 - 100.0 fL Final  . MCH 11/24/2012 29.6  26.0 - 34.0 pg Final  . MCHC 11/24/2012 33.4  30.0 - 36.0 g/dL Final  . RDW 21/30/8657 12.7  11.5 - 15.5  % Final  . Platelets 11/24/2012 268  150 - 400 K/uL Final  . MRSA, PCR 11/24/2012 NEGATIVE  NEGATIVE Final  . Staphylococcus aureus 11/24/2012 NEGATIVE  NEGATIVE Final   Comment:                                 The Xpert SA Assay (FDA                          approved for NASAL specimens                          in patients over 88 years of age),                          is one component of                          a comprehensive surveillance                          program.  Test performance has                          been validated by [employer] for patients greater                          than or equal to 87 year old.                          It is not intended                          to diagnose infection nor to  guide or monitor treatment.  . ABO/RH(D) 11/24/2012 A NEG   Final  . WBC, UA 11/24/2012 3-6  <3 WBC/hpf Final     X-Rays:Dg Chest 2 View  11/24/2012   *RADIOLOGY REPORT*  Clinical Data: Preop hip surgery  CHEST - 2 VIEW  Comparison: 05/24/2009  Findings: Cardiac and mediastinal contours are normal.  Vascularity is normal.  Lungs are clear of infiltrate or effusion.  Mild apical scarring bilaterally, unchanged.  IMPRESSION: No active cardiopulmonary disease.   Original Report Authenticated By: Janeece Riggers, M.D.   Dg Hip Portable 1 View Right  12/02/2012   *RADIOLOGY REPORT*  Clinical Data: History of right total hip replacement.  PORTABLE RIGHT HIP - 1 VIEW  Comparison: 03/24/2012.  Findings: AP image is submitted.  Total right hip arthroplasty procedure has been performed.  On the AP image there is expected relationship between femoral and acetabular components.  No disruption of hardware is seen.  No dislocation is evident.  There is osteopenic appearance of bones.  IMPRESSION: Post total right hip arthroplasty procedure.  No dislocation or disruption of hardware is evident.   Original Report Authenticated By: Onalee Hua Call     EKG: Orders placed during the hospital encounter of 11/24/12  . EKG 12-LEAD  . EKG 12-LEAD     Hospital Course: Patient was admitted to [facility] and taken to the OR and underwent the above state procedure without complications.  Patient tolerated the procedure well and was later transferred to the recovery room and then to the orthopaedic floor for postoperative care.  They were given PO and IV analgesics for pain control following their surgery.  They were given 24 hours of postoperative antibiotics of  Anti-infectives   Start     Dose/Rate Route Frequency Ordered Stop   12/02/12 1330  clindamycin (CLEOCIN) IVPB 600 mg     600 mg 100 mL/hr over 30 Minutes Intravenous Every 6 hours 12/02/12 1041 12/02/12 2025   12/02/12 0813  polymyxin B 500,000 Units, bacitracin 50,000 Units in sodium chloride irrigation 0.9 % 500 mL irrigation  Status:  Discontinued       As needed 12/02/12 0813 12/02/12 0926   12/02/12 0626  clindamycin (CLEOCIN) IVPB 900 mg     900 mg 100 mL/hr over 30 Minutes Intravenous On call to O.R. 12/02/12 4098 12/02/12 0730     and started on DVT prophylaxis in the form of Xarelto.   PT and OT were ordered for total hip protocol.  The patient was allowed to be PWB 50%. Discharge planning was consulted to help with postop disposition and equipment needs.  Patient had a good night on the evening of surgery.  They started to get up OOB with therapy on day one.  The knee immobilizer was removed and discontinued.  Dressing was changed post op day one due to moderate amount of frank bloody drainage. Continued to work with therapy into day two.  Dressing was changed on day two and the incision was clean and dry.  Hgb was monitored closely as she developed postoperative acute blood loss anemia but she remained asymptomatic. By day three, the patient had progressed with therapy and meeting their goals.  Incision was healing well.  Patient was seen in rounds and was ready to go  home.   Discharge Medications: Prior to Admission medications   Medication Sig Start Date End Date Taking? Authorizing Provider  calcium-vitamin D (OSCAL) 250-125 MG-UNIT per tablet Take 1 tablet by mouth daily.  Yes Historical Provider, MD  carvedilol (COREG) 6.25 MG tablet Take 6.25 mg by mouth 2 (two) times daily with a meal.   Yes Historical Provider, MD  doxazosin (CARDURA) 2 MG tablet Take 2 mg by mouth at bedtime. Takes 1 total dose 3 mg   Yes Historical Provider, MD  levothyroxine (SYNTHROID, LEVOTHROID) 75 MCG tablet Take 75 mcg by mouth every morning.    Yes Historical Provider, MD  losartan (COZAAR) 100 MG tablet Take 100 mg by mouth at bedtime.    Yes Historical Provider, MD  polyethylene glycol (MIRALAX / GLYCOLAX) packet Take 17 g by mouth daily.   Yes Historical Provider, MD  ferrous sulfate 325 (65 FE) MG tablet Take 1 tablet (325 mg total) by mouth 3 (three) times daily after meals. 12/05/12   Gareth Fitzner Tamala Ser, PA-C  HYDROmorphone (DILAUDID) 2 MG tablet Take 1 tablet (2 mg total) by mouth every 6 (six) hours as needed for pain. 12/05/12   Tihanna Goodson Tamala Ser, PA-C  methocarbamol (ROBAXIN) 500 MG tablet Take 1 tablet (500 mg total) by mouth every 6 (six) hours as needed. 12/05/12   Herny Scurlock Tamala Ser, PA-C  rivaroxaban (XARELTO) 10 MG TABS tablet Take 1 tablet (10 mg total) by mouth daily with breakfast. 12/05/12   Mardell Suttles Tamala Ser, PA-C  traMADol (ULTRAM) 50 MG tablet Take 1 tablet (50 mg total) by mouth every 6 (six) hours as needed for pain. 12/05/12   Caylah Plouff Tamala Ser, PA-C    Diet: Cardiac diet Activity:WBAT No bending hip over 90 degrees- A "L" Angle Do not cross legs Do not let foot roll inward When turning these patients a pillow should be placed between the patient's legs to prevent crossing. Patients should have the affected knee fully extended when trying to sit or stand from all surfaces to prevent excessive hip flexion. When ambulating and  turning toward the affected side the affected leg should have the toes turned out prior to moving the walker and the rest of patient's body as to prevent internal rotation/ turning in of the leg. Abduction pillows are the most effective way to prevent a patient from not crossing legs or turning toes in at rest. If an abduction pillow is not ordered placing a regular pillow length wise between the patient's legs is also an effective reminder. It is imperative that these precautions be maintained so that the surgical hip does not dislocate. Follow-up:in 2 weeks Disposition - Home Discharged Condition: good   Discharge Orders   Future Appointments Provider Department Dept Phone   05/31/2013 1:30 PM Delcie Roch [facility] [phone]   05/31/2013 2:00 PM Samul Dada, MD [facility] [place] [facility] MEDICAL ONCOLOGY [phone]   Future Orders Complete By Expires     Call MD / Call 911  As directed     Comments:      If you experience chest pain or shortness of breath, CALL 911 and be transported to the hospital emergency room.  If you develope a fever above 101 F, pus (white drainage) or increased drainage or redness at the wound, or calf pain, call your surgeon's office.    Constipation Prevention  As directed     Comments:      Drink plenty of fluids.  Prune juice may be helpful.  You may use a stool softener, such as Colace (over the counter) 100 mg twice a day.  Use MiraLax (over the counter) for constipation as needed.  Discharge instructions  As directed     Comments:      Walk with your walker. Weight bearing as instructed. Home Health Agency will follow you at home for your therapy  Change your dressing daily. Shower only, no tub bath. Call if any temperatures greater than 101 or any wound complications: [phone] during the day and ask for Dr. Jeannetta Ellis nurse, [facility] [place].    Driving restrictions  As directed     Comments:      No driving     Follow the hip precautions as taught in Physical Therapy  As directed     Increase activity slowly as tolerated  As directed         Medication List    STOP taking these medications       aspirin 81 MG chewable tablet      TAKE these medications       calcium-vitamin D 250-125 MG-UNIT per tablet  Commonly known as:  OSCAL  Take 1 tablet by mouth daily.     carvedilol 6.25 MG tablet  Commonly known as:  COREG  Take 6.25 mg by mouth 2 (two) times daily with a meal.     doxazosin 2 MG tablet  Commonly known as:  CARDURA  Take 2 mg by mouth at bedtime. Takes 1 total dose 3 mg     ferrous sulfate 325 (65 FE) MG tablet  Take 1 tablet (325 mg total) by mouth 3 (three) times daily after meals.     HYDROmorphone 2 MG tablet  Commonly known as:  DILAUDID  Take 1 tablet (2 mg total) by mouth every 6 (six) hours as needed for pain.     levothyroxine 75 MCG tablet  Commonly known as:  SYNTHROID, LEVOTHROID  Take 75 mcg by mouth every morning.     losartan 100 MG tablet  Commonly known as:  COZAAR  Take 100 mg by mouth at bedtime.     methocarbamol 500 MG tablet  Commonly known as:  ROBAXIN  Take 1 tablet (500 mg total) by mouth every 6 (six) hours as needed.     polyethylene glycol packet  Commonly known as:  MIRALAX / GLYCOLAX  Take 17 g by mouth daily.     rivaroxaban 10 MG Tabs tablet  Commonly known as:  XARELTO  Take 1 tablet (10 mg total) by mouth daily with breakfast.     traMADol 50 MG tablet  Commonly known as:  ULTRAM  Take 1 tablet (50 mg total) by mouth every 6 (six) hours as needed for pain.           Follow-up Information   Follow up with GIOFFRE,RONALD A, MD. Schedule an appointment as soon as possible for a visit in 2 weeks.   Contact information:   [address] [place] [address] [place] [address] [phone]       Signed: Kerby Nora 12/06/2012, 8:50 AM

## 2013-03-07 ENCOUNTER — Other Ambulatory Visit: Payer: Self-pay

## 2013-03-07 DIAGNOSIS — Z1231 Encounter for screening mammogram for malignant neoplasm of breast: Secondary | ICD-10-CM

## 2013-04-27 ENCOUNTER — Ambulatory Visit: Payer: Medicare Other

## 2013-04-29 ENCOUNTER — Ambulatory Visit
Admission: RE | Admit: 2013-04-29 | Discharge: 2013-04-29 | Disposition: A | Discharge status: Home / Self Care | Payer: Medicare Other | Source: Ambulatory Visit

## 2013-04-29 ENCOUNTER — Ambulatory Visit: Payer: Medicare Other

## 2013-04-29 DIAGNOSIS — Z1231 Encounter for screening mammogram for malignant neoplasm of breast: Secondary | ICD-10-CM

## 2013-05-30 ENCOUNTER — Other Ambulatory Visit: Payer: Self-pay

## 2013-05-30 DIAGNOSIS — C49A0 Gastrointestinal stromal tumor, unspecified site: Secondary | ICD-10-CM

## 2013-05-31 ENCOUNTER — Other Ambulatory Visit (HOSPITAL_BASED_OUTPATIENT_CLINIC_OR_DEPARTMENT_OTHER): Payer: Medicare Other | Admitting: Lab

## 2013-05-31 ENCOUNTER — Ambulatory Visit (HOSPITAL_BASED_OUTPATIENT_CLINIC_OR_DEPARTMENT_OTHER): Payer: Medicare Other | Admitting: Internal Medicine

## 2013-05-31 VITALS — BP 193/80 | HR 62 | Temp 97.1°F | Wt 151.8 lb

## 2013-05-31 DIAGNOSIS — C49A0 Gastrointestinal stromal tumor, unspecified site: Secondary | ICD-10-CM

## 2013-05-31 DIAGNOSIS — Z85028 Personal history of other malignant neoplasm of stomach: Secondary | ICD-10-CM

## 2013-05-31 LAB — CBC WITH DIFFERENTIAL/PLATELET
BASO%: 0.7 % (ref 0.0–2.0)
Basophils Absolute: 0 10*3/uL (ref 0.0–0.1)
HCT: 35.9 % (ref 34.8–46.6)
HGB: 12 g/dL (ref 11.6–15.9)
LYMPH%: 32.7 % (ref 14.0–49.7)
MCH: 29.4 pg (ref 25.1–34.0)
MCHC: 33.5 g/dL (ref 31.5–36.0)
MONO#: 0.3 10*3/uL (ref 0.1–0.9)
NEUT%: 59 % (ref 38.4–76.8)
Platelets: 233 10*3/uL (ref 145–400)
WBC: 5.9 10*3/uL (ref 3.9–10.3)
lymph#: 1.9 10*3/uL (ref 0.9–3.3)

## 2013-05-31 LAB — COMPREHENSIVE METABOLIC PANEL (CC13)
Anion Gap: 8 mEq/L (ref 3–11)
BUN: 12.3 mg/dL (ref 7.0–26.0)
CO2: 26 mEq/L (ref 22–29)
Calcium: 9.8 mg/dL (ref 8.4–10.4)
Chloride: 106 mEq/L (ref 98–109)
Creatinine: 0.8 mg/dL (ref 0.6–1.1)
Total Bilirubin: 0.34 mg/dL (ref 0.20–1.20)

## 2013-05-31 LAB — LACTATE DEHYDROGENASE (CC13): LDH: 172 U/L (ref 125–245)

## 2013-06-01 ENCOUNTER — Telehealth: Payer: Self-pay | Admitting: Internal Medicine

## 2013-06-01 NOTE — Telephone Encounter (Signed)
S/w the pt regarding her 1 year f/u appt in nov 2015@1 :00pm.

## 2013-06-02 NOTE — Progress Notes (Signed)
[facility] OFFICE PROGRESS NOTE  GREEN, Cheryl Ishihara, MD [address], [address] [place] [address]  DIAGNOSIS: GIST, malignant - Plan: CBC with Differential, Comprehensive metabolic panel  Chief Complaint  Patient presents with  . GIST, malignant    CURRENT THERAPY: Observation,.  INTERVAL HISTORY: Cheryl Yoder 79 y.o. 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  She was last seen by [name] NP on 06/01/2012.   She reports her weight is stable and her appetite is good.  She last saw her PCP [name] about 3-4 months without changes in her medications.  She recently received the flu shot.    Of note, it was felt the patient had an excellent prognosis with a cure rate after surgery of approximately 95% for GIST. The patient did not require any adjuvant treatment. She saw Dr. [name] who carried out endoscopic ultrasound on 08/14/2011, and this was negative. Her main complaints concerned her arthritis in her right hip. She takes OTC Aleve which relieves her pain which she describes as intermittent, non-radiating and worse in the morning. Ms. [name] denies any other symptomatology today including abdominal pain, SOB, unusual bleeding, trouble eating, N/V/D or constipation, fever, chills or night sweats.  MEDICAL HISTORY: Past Medical History  Diagnosis Date  . GIST (gastrointestinal stromal tumor), malignant   . Hypertension   . Hypothyroid   . Cancer     gist    INTERIM HISTORY: has GIST, malignant; Dysphagia; Esophageal stricture; Osteoarthritis of right hip; and Postoperative anemia due to acute blood loss on her problem list.    ALLERGIES:  is allergic to ciprocinonide; codeine sulfate; penicillins; and sulfonamide derivatives.  MEDICATIONS: has a current medication list which includes the following prescription(s): aspirin, carvedilol, doxazosin, ferrous  sulfate, levothyroxine, losartan, and calcium-vitamin d.  SURGICAL HISTORY:  Past Surgical History  Procedure Laterality Date  . Stomach surgery  2006    gist tumor removed  . Eus  08/14/2011    Procedure: UPPER ENDOSCOPIC ULTRASOUND (EUS) LINEAR;  Surgeon: Rob Bunting, MD;  Location: [facility] ENDOS[facility];  Service: Endoscopy;  Laterality: N/A;  . Esophagogastroduodenoscopy  08/14/2011    Procedure: ESOPHAGOGASTRODUODENOSCOPY (EGD);  Surgeon: Rob Bunting, MD;  Location: [facility];  Service: Endoscopy;  Laterality: N/A;  . Abdominal hysterectomy    . Total hip arthroplasty Right 12/02/2012    Procedure: RIGHT TOTAL HIP ARTHROPLASTY;  Surgeon: Jacki Cones, MD;  Location: [facility] ORS;  Service: Orthopedics;  Laterality: Right;   PROBLEM LIST: 1. Gastrointestinal stromal tumor of the lesser curvature measuring 7.5 cm with biopsy carried out on 12/30/2007 and definitive surgery  which was a complete resection on 03/28/2008. Margins were negative. There was no angiolymphatic or perineural invasion. C-KIT and CD34 were both positive. The patient remains without evidence of disease and is not on any specific therapy. CT scan of the abdomen and pelvis with IV contrast was carried out on 11/19/2010, showed no evidence for recurrence. The patient had endoscopic ultrasound by Dr. [name] on 08/14/2011, also without evidence for recurrence.  2. Hypertension  3. Diverticulosis  4. Hypothyroidism   REVIEW OF SYSTEMS:   Constitutional: Denies fevers, chills or abnormal weight loss Eyes: Denies blurriness of vision Ears, nose, mouth, throat, and face: Denies mucositis or sore throat Respiratory: Denies cough, dyspnea or wheezes Cardiovascular: Denies palpitation, chest discomfort or lower extremity swelling Gastrointestinal:  Denies nausea, heartburn or change  in bowel habits Skin: Denies abnormal skin rashes Lymphatics: Denies new lymphadenopathy or easy bruising Neurological:Denies numbness, tingling  or new weaknesses Behavioral/Psych: Mood is stable, no new changes  All other systems were reviewed with the patient and are negative.  PHYSICAL EXAMINATION: ECOG PERFORMANCE STATUS: 0 - Asymptomatic  Blood pressure 193/80, pulse 62, temperature 97.1 F (36.2 C), temperature source Oral, weight 151 lb 12.8 oz (68.856 kg).  GENERAL:alert, no distress and comfortable SKIN: skin color, texture, turgor are normal, no rashes or significant lesions EYES: normal, Conjunctiva are pink and non-injected, sclera clear OROPHARYNX:no exudate, no erythema and lips, buccal mucosa, and tongue normal  NECK: supple, thyroid normal size, non-tender, without nodularity LYMPH:  no palpable lymphadenopathy in the cervical, axillary or supraclavicular LUNGS: clear to auscultation and percussion with normal breathing effort HEART: regular rate & rhythm and no murmurs and no lower extremity edema ABDOMEN:abdomen soft, non-tender and normal bowel sounds Musculoskeletal:no cyanosis of digits and no clubbing  NEURO: alert & oriented x 3 with fluent speech, no focal motor/sensory deficits   LABORATORY DATA: Results for orders placed in visit on 05/31/13 (from the past 48 hour(s))  CBC WITH DIFFERENTIAL     Status: None   Collection Time    05/31/13  1:39 PM      Result Value Range   WBC 5.9  3.9 - 10.3 10e3/uL   NEUT# 3.5  1.5 - 6.5 10e3/uL   HGB 12.0  11.6 - 15.9 g/dL   HCT 98.1  19.1 - 47.8 %   Platelets 233  145 - 400 10e3/uL   MCV 87.8  79.5 - 101.0 fL   MCH 29.4  25.1 - 34.0 pg   MCHC 33.5  31.5 - 36.0 g/dL   RBC 2.95  6.21 - 3.08 10e6/uL   RDW 13.8  11.2 - 14.5 %   lymph# 1.9  0.9 - 3.3 10e3/uL   MONO# 0.3  0.1 - 0.9 10e3/uL   Eosinophils Absolute 0.1  0.0 - 0.5 10e3/uL   Basophils Absolute 0.0  0.0 - 0.1 10e3/uL   NEUT% 59.0  38.4 - 76.8 %   LYMPH% 32.7  14.0 - 49.7 %   MONO% 5.4  0.0 - 14.0 %   EOS% 2.2  0.0 - 7.0 %   BASO% 0.7  0.0 - 2.0 %  COMPREHENSIVE METABOLIC PANEL (CC13)     Status:  None   Collection Time    05/31/13  1:39 PM      Result Value Range   Sodium 140  136 - 145 mEq/L   Potassium 3.8  3.5 - 5.1 mEq/L   Chloride 106  98 - 109 mEq/L   CO2 26  22 - 29 mEq/L   Glucose 113  70 - 140 mg/dl   BUN 65.7  7.0 - 84.6 mg/dL   Creatinine 0.8  0.6 - 1.1 mg/dL   Total Bilirubin 9.62  0.20 - 1.20 mg/dL   Alkaline Phosphatase 74  40 - 150 U/L   AST 18  5 - 34 U/L   ALT 8  0 - 55 U/L   Total Protein 6.9  6.4 - 8.3 g/dL   Albumin 3.6  3.5 - 5.0 g/dL   Calcium 9.8  8.4 - 95.2 mg/dL   Anion Gap 8  3 - 11 mEq/L  LACTATE DEHYDROGENASE (CC13)     Status: None   Collection Time    05/31/13  1:39 PM      Result Value Range  LDH 172  125 - 245 U/L       Labs:  Lab Results  Component Value Date   WBC 5.9 05/31/2013   HGB 12.0 05/31/2013   HCT 35.9 05/31/2013   MCV 87.8 05/31/2013   PLT 233 05/31/2013   NEUTROABS 3.5 05/31/2013      Chemistry      Component Value Date/Time   NA 140 05/31/2013 1339   NA 138 12/04/2012 0452   NA 145 11/19/2010 1001   K 3.8 05/31/2013 1339   K 3.6 12/04/2012 0452   K 4.5 11/19/2010 1001   CL 104 12/04/2012 0452   CL 107 06/01/2012 1525   CL 100 11/19/2010 1001   CO2 26 05/31/2013 1339   CO2 31 12/04/2012 0452   CO2 28 11/19/2010 1001   BUN 12.3 05/31/2013 1339   BUN 10 12/04/2012 0452   BUN 13 11/19/2010 1001   CREATININE 0.8 05/31/2013 1339   CREATININE 0.79 12/04/2012 0452   CREATININE 0.9 11/19/2010 1001      Component Value Date/Time   CALCIUM 9.8 05/31/2013 1339   CALCIUM 8.6 12/04/2012 0452   CALCIUM 9.6 11/19/2010 1001   ALKPHOS 74 05/31/2013 1339   ALKPHOS 78 11/24/2012 1330   ALKPHOS 80 11/19/2010 1001   AST 18 05/31/2013 1339   AST 19 11/24/2012 1330   AST 21 11/19/2010 1001   ALT 8 05/31/2013 1339   ALT 13 11/24/2012 1330   ALT 21 11/19/2010 1001   BILITOT 0.34 05/31/2013 1339   BILITOT 0.3 11/24/2012 1330   BILITOT 0.60 11/19/2010 1001     Basic Metabolic Panel:  Recent Labs Lab 05/31/13 1339  NA 140  K 3.8  CO2 26   GLUCOSE 113  BUN 12.3  CREATININE 0.8  CALCIUM 9.8   Liver Function Tests:  Recent Labs Lab 05/31/13 1339  AST 18  ALT 8  ALKPHOS 74  BILITOT 0.34  PROT 6.9  ALBUMIN 3.6   Studies:  No results found.   RADIOGRAPHIC STUDIES: 1. Bone density scan from 07/26/2010 yielded a T-score of the left femur of -2.1 and a T-score of the left forearm of -0.3. There was felt to be a statistically significant 3.9% decrease in the bone mineral density of the total left hip as compared with the baseline study done on 07/01/2006. There was felt to be a statistically significant 4.2% decrease in the bone mineral density of the left forearm as compared with 07/17/2008. According to the [facility] criteria the patient qualifies as having osteopenia with a T-score between - 1.0 and -2.5.  2. CT of abdomen and pelvis with IV contrast carried out on 11/19/2010 showed no evidence for metastatic disease in the abdomen and pelvis. There was a small hiatal hernia with suspicion for distal esophagitis. There was some possible urinary bladder wall thickening which could be due to underdistention. Cystitis was felt to be less likely. 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  3. Digital screening mammogram from 04/24/2011 was negative.  4. Diagnostic x-ray of complete right hip on 03/24/2012 showed mild right hip osteoarthritis.  5. Digital screening mammogram on 04/26/2012 show no mammographic evidence of malignancy.    ASSESSMENT: PECOLA HAXTON 79 y.o. female with a history of GIST, malignant - Plan: CBC with Differential, Comprehensive metabolic panel   PLAN:  1. GIST. --Ms. Poinsett continues to do well with no signs of recurrent GIST (gastrointestinal stromal tumor). It is  now over 5 years from the time of diagnosis on 12/30/2007. We will continue an annual follow-up.   2. Follow-up. --We plan to see Ms. Delapaz again in  12 months on 05/31/2014, at which time we will check CBC, CMP and LDH.   All questions were answered. The patient knows to call the clinic with any problems, questions or concerns. We can certainly see the patient much sooner if necessary.  I spent 10 minutes counseling the patient face to face. The total time spent in the appointment was 15 minutes.    Diamantina Edinger, MD 06/02/2013 5:16 AM

## 2014-02-23 ENCOUNTER — Other Ambulatory Visit: Payer: Self-pay

## 2014-02-23 DIAGNOSIS — Z1231 Encounter for screening mammogram for malignant neoplasm of breast: Secondary | ICD-10-CM

## 2014-05-01 ENCOUNTER — Ambulatory Visit
Admission: RE | Admit: 2014-05-01 | Discharge: 2014-05-01 | Disposition: A | Discharge status: Home / Self Care | Payer: Medicare Other | Source: Ambulatory Visit

## 2014-05-01 DIAGNOSIS — Z1231 Encounter for screening mammogram for malignant neoplasm of breast: Secondary | ICD-10-CM

## 2014-05-30 ENCOUNTER — Telehealth: Payer: Self-pay | Admitting: Hematology

## 2014-05-30 ENCOUNTER — Other Ambulatory Visit: Payer: Self-pay | Admitting: *Deleted

## 2014-05-30 NOTE — Telephone Encounter (Signed)
, °

## 2014-06-01 ENCOUNTER — Other Ambulatory Visit: Payer: Medicare Other

## 2014-06-01 ENCOUNTER — Ambulatory Visit: Payer: Medicare Other

## 2014-06-09 ENCOUNTER — Telehealth: Payer: Self-pay | Admitting: Hematology

## 2014-06-12 ENCOUNTER — Ambulatory Visit: Payer: Medicare Other

## 2014-06-12 ENCOUNTER — Other Ambulatory Visit: Payer: Medicare Other

## 2014-06-27 ENCOUNTER — Other Ambulatory Visit: Payer: Self-pay | Admitting: *Deleted

## 2014-06-27 DIAGNOSIS — C49A0 Gastrointestinal stromal tumor, unspecified site: Secondary | ICD-10-CM

## 2014-06-28 ENCOUNTER — Ambulatory Visit: Payer: Medicare Other | Admitting: Hematology

## 2014-06-28 ENCOUNTER — Other Ambulatory Visit: Payer: Self-pay | Admitting: *Deleted

## 2014-06-28 ENCOUNTER — Other Ambulatory Visit: Payer: Medicare Other

## 2014-06-29 ENCOUNTER — Telehealth: Payer: Self-pay | Admitting: Hematology

## 2014-06-29 NOTE — Telephone Encounter (Signed)
LM to confirm appt for Jan. Mailed cal. °

## 2014-07-12 ENCOUNTER — Telehealth: Payer: Self-pay | Admitting: Hematology

## 2014-07-12 ENCOUNTER — Telehealth: Payer: Self-pay | Admitting: *Deleted

## 2014-07-12 NOTE — Telephone Encounter (Signed)
LM returning call in regaurds to fax sent confirming if needed appt. This is a one year follow up that was r/s due to missed appt from 06/28/14 r/s to 07/27/14.

## 2014-07-12 NOTE — Telephone Encounter (Signed)
Pt's daughter Cheryl Yoder called questioning why pt has an apt, call pt back and advised this is a yearly f/u and that she would need to come in and discuss further apts if needed.... I also called her Jacque left msg that this is a yearly f/u and that she would need to come in and discuss further apts if needed.... KJ

## 2014-07-12 NOTE — Telephone Encounter (Signed)
Daughter Mikle Bosworth called and left message questioning if it is necessary for pt to f/u in Jan 2016.  Spoke with Mikle Bosworth and informed her that this is pt's annual follow up with lab.  Informed Jaque that Dr. Burr Medico will decide whether and/or if pt needs to come to clinic anymore after January visit.  Jaque voiced understanding and stated she would relay message to pt. Emiliano Dyer    Phone     [phone].

## 2014-07-27 ENCOUNTER — Other Ambulatory Visit: Payer: Medicare Other

## 2014-07-27 ENCOUNTER — Telehealth: Payer: Self-pay | Admitting: *Deleted

## 2014-07-27 ENCOUNTER — Ambulatory Visit: Payer: Medicare Other | Admitting: Hematology

## 2014-07-27 NOTE — Telephone Encounter (Signed)
Called daughter regarding missed appt & she states that her mother is not going to come back.  "She thinks she is healed" & thanked me for calling. Note to Dr Burr Medico.

## 2015-01-01 ENCOUNTER — Other Ambulatory Visit: Payer: Self-pay | Admitting: Internal Medicine

## 2015-01-01 DIAGNOSIS — I159 Secondary hypertension, unspecified: Secondary | ICD-10-CM

## 2015-01-01 DIAGNOSIS — R109 Unspecified abdominal pain: Secondary | ICD-10-CM

## 2015-01-02 ENCOUNTER — Ambulatory Visit
Admission: RE | Admit: 2015-01-02 | Discharge: 2015-01-02 | Disposition: A | Discharge status: Home / Self Care | Payer: PPO | Source: Ambulatory Visit | Attending: Internal Medicine | Admitting: Internal Medicine

## 2015-01-02 DIAGNOSIS — R109 Unspecified abdominal pain: Secondary | ICD-10-CM

## 2015-01-02 MED ORDER — IOPAMIDOL (ISOVUE-300) INJECTION 61%
100.0000 mL | Freq: Once | INTRAVENOUS | Status: AC | PRN
  Administered 2015-01-02: 100 mL via INTRAVENOUS

## 2015-01-09 ENCOUNTER — Ambulatory Visit
Admission: RE | Admit: 2015-01-09 | Discharge: 2015-01-09 | Disposition: A | Discharge status: Home / Self Care | Payer: PPO | Source: Ambulatory Visit | Attending: Internal Medicine | Admitting: Internal Medicine

## 2015-01-09 DIAGNOSIS — I159 Secondary hypertension, unspecified: Secondary | ICD-10-CM

## 2015-02-27 ENCOUNTER — Other Ambulatory Visit: Payer: Self-pay

## 2015-02-27 DIAGNOSIS — Z1231 Encounter for screening mammogram for malignant neoplasm of breast: Secondary | ICD-10-CM

## 2015-05-03 ENCOUNTER — Ambulatory Visit
Admission: RE | Admit: 2015-05-03 | Discharge: 2015-05-03 | Disposition: A | Discharge status: Home / Self Care | Payer: PPO | Source: Ambulatory Visit

## 2015-05-03 DIAGNOSIS — Z1231 Encounter for screening mammogram for malignant neoplasm of breast: Secondary | ICD-10-CM

## 2015-08-02 DIAGNOSIS — I10 Essential (primary) hypertension: Secondary | ICD-10-CM | POA: Diagnosis not present

## 2015-08-16 DIAGNOSIS — I10 Essential (primary) hypertension: Secondary | ICD-10-CM | POA: Diagnosis not present

## 2015-09-11 DIAGNOSIS — L821 Other seborrheic keratosis: Secondary | ICD-10-CM | POA: Diagnosis not present

## 2015-09-11 DIAGNOSIS — L570 Actinic keratosis: Secondary | ICD-10-CM | POA: Diagnosis not present

## 2015-09-18 DIAGNOSIS — H903 Sensorineural hearing loss, bilateral: Secondary | ICD-10-CM | POA: Diagnosis not present

## 2015-09-21 DIAGNOSIS — I10 Essential (primary) hypertension: Secondary | ICD-10-CM | POA: Diagnosis not present

## 2015-12-03 DIAGNOSIS — H40012 Open angle with borderline findings, low risk, left eye: Secondary | ICD-10-CM | POA: Diagnosis not present

## 2015-12-03 DIAGNOSIS — H02831 Dermatochalasis of right upper eyelid: Secondary | ICD-10-CM | POA: Diagnosis not present

## 2015-12-03 DIAGNOSIS — H40011 Open angle with borderline findings, low risk, right eye: Secondary | ICD-10-CM | POA: Diagnosis not present

## 2015-12-03 DIAGNOSIS — H02834 Dermatochalasis of left upper eyelid: Secondary | ICD-10-CM | POA: Diagnosis not present

## 2016-01-21 DIAGNOSIS — I10 Essential (primary) hypertension: Secondary | ICD-10-CM | POA: Diagnosis not present

## 2016-02-27 ENCOUNTER — Other Ambulatory Visit: Payer: Self-pay | Admitting: Internal Medicine

## 2016-02-27 DIAGNOSIS — Z1231 Encounter for screening mammogram for malignant neoplasm of breast: Secondary | ICD-10-CM

## 2016-05-05 ENCOUNTER — Ambulatory Visit
Admission: RE | Admit: 2016-05-05 | Discharge: 2016-05-05 | Disposition: A | Discharge status: Home / Self Care | Payer: PPO | Source: Ambulatory Visit | Attending: Internal Medicine | Admitting: Internal Medicine

## 2016-05-05 DIAGNOSIS — Z1231 Encounter for screening mammogram for malignant neoplasm of breast: Secondary | ICD-10-CM

## 2016-06-30 DIAGNOSIS — Z0001 Encounter for general adult medical examination with abnormal findings: Secondary | ICD-10-CM | POA: Diagnosis not present

## 2016-06-30 DIAGNOSIS — E039 Hypothyroidism, unspecified: Secondary | ICD-10-CM | POA: Diagnosis not present

## 2016-06-30 DIAGNOSIS — E7800 Pure hypercholesterolemia, unspecified: Secondary | ICD-10-CM | POA: Diagnosis not present

## 2016-06-30 DIAGNOSIS — I10 Essential (primary) hypertension: Secondary | ICD-10-CM | POA: Diagnosis not present

## 2016-06-30 DIAGNOSIS — D559 Anemia due to enzyme disorder, unspecified: Secondary | ICD-10-CM | POA: Diagnosis not present

## 2016-09-22 DIAGNOSIS — H5340 Unspecified visual field defects: Secondary | ICD-10-CM | POA: Diagnosis not present

## 2016-09-22 DIAGNOSIS — H40013 Open angle with borderline findings, low risk, bilateral: Secondary | ICD-10-CM | POA: Diagnosis not present

## 2016-09-23 DIAGNOSIS — H903 Sensorineural hearing loss, bilateral: Secondary | ICD-10-CM | POA: Diagnosis not present

## 2016-09-24 DIAGNOSIS — H903 Sensorineural hearing loss, bilateral: Secondary | ICD-10-CM | POA: Diagnosis not present

## 2016-09-24 DIAGNOSIS — H6123 Impacted cerumen, bilateral: Secondary | ICD-10-CM | POA: Diagnosis not present

## 2016-11-26 DIAGNOSIS — H40013 Open angle with borderline findings, low risk, bilateral: Secondary | ICD-10-CM | POA: Diagnosis not present

## 2016-12-29 DIAGNOSIS — I10 Essential (primary) hypertension: Secondary | ICD-10-CM | POA: Diagnosis not present

## 2017-03-09 ENCOUNTER — Other Ambulatory Visit: Payer: Self-pay | Admitting: Internal Medicine

## 2017-03-09 DIAGNOSIS — Z1231 Encounter for screening mammogram for malignant neoplasm of breast: Secondary | ICD-10-CM

## 2017-05-07 ENCOUNTER — Ambulatory Visit: Payer: PPO

## 2017-05-26 ENCOUNTER — Ambulatory Visit
Admission: RE | Admit: 2017-05-26 | Discharge: 2017-05-26 | Disposition: A | Discharge status: Home / Self Care | Payer: PPO | Source: Ambulatory Visit | Attending: Internal Medicine | Admitting: Internal Medicine

## 2017-05-26 DIAGNOSIS — Z1231 Encounter for screening mammogram for malignant neoplasm of breast: Secondary | ICD-10-CM | POA: Diagnosis not present

## 2017-07-07 DIAGNOSIS — E039 Hypothyroidism, unspecified: Secondary | ICD-10-CM | POA: Diagnosis not present

## 2017-07-07 DIAGNOSIS — I10 Essential (primary) hypertension: Secondary | ICD-10-CM | POA: Diagnosis not present

## 2017-07-07 DIAGNOSIS — Z1211 Encounter for screening for malignant neoplasm of colon: Secondary | ICD-10-CM | POA: Diagnosis not present

## 2017-07-07 DIAGNOSIS — Z6841 Body Mass Index (BMI) 40.0 and over, adult: Secondary | ICD-10-CM | POA: Diagnosis not present

## 2017-07-07 DIAGNOSIS — C49A0 Gastrointestinal stromal tumor, unspecified site: Secondary | ICD-10-CM | POA: Diagnosis not present

## 2017-07-22 DIAGNOSIS — Z1211 Encounter for screening for malignant neoplasm of colon: Secondary | ICD-10-CM | POA: Diagnosis not present

## 2017-07-22 DIAGNOSIS — I10 Essential (primary) hypertension: Secondary | ICD-10-CM | POA: Diagnosis not present

## 2017-07-31 DIAGNOSIS — I10 Essential (primary) hypertension: Secondary | ICD-10-CM | POA: Diagnosis not present

## 2017-09-22 DIAGNOSIS — Z961 Presence of intraocular lens: Secondary | ICD-10-CM | POA: Diagnosis not present

## 2017-09-22 DIAGNOSIS — H40013 Open angle with borderline findings, low risk, bilateral: Secondary | ICD-10-CM | POA: Diagnosis not present

## 2017-10-05 DIAGNOSIS — H9221 Otorrhagia, right ear: Secondary | ICD-10-CM | POA: Diagnosis not present

## 2017-10-05 DIAGNOSIS — H903 Sensorineural hearing loss, bilateral: Secondary | ICD-10-CM | POA: Diagnosis not present

## 2017-10-14 DIAGNOSIS — H903 Sensorineural hearing loss, bilateral: Secondary | ICD-10-CM | POA: Diagnosis not present

## 2017-12-02 DIAGNOSIS — I10 Essential (primary) hypertension: Secondary | ICD-10-CM | POA: Diagnosis not present

## 2017-12-02 DIAGNOSIS — C49A0 Gastrointestinal stromal tumor, unspecified site: Secondary | ICD-10-CM | POA: Diagnosis not present

## 2018-03-01 ENCOUNTER — Other Ambulatory Visit: Payer: Self-pay | Admitting: Internal Medicine

## 2018-03-01 DIAGNOSIS — Z1231 Encounter for screening mammogram for malignant neoplasm of breast: Secondary | ICD-10-CM

## 2018-03-10 DIAGNOSIS — E030 Congenital hypothyroidism with diffuse goiter: Secondary | ICD-10-CM | POA: Diagnosis not present

## 2018-05-28 ENCOUNTER — Ambulatory Visit
Admission: RE | Admit: 2018-05-28 | Discharge: 2018-05-28 | Disposition: A | Discharge status: Home / Self Care | Payer: PPO | Source: Ambulatory Visit | Attending: Internal Medicine | Admitting: Internal Medicine

## 2018-05-28 DIAGNOSIS — Z1231 Encounter for screening mammogram for malignant neoplasm of breast: Secondary | ICD-10-CM

## 2018-07-16 DIAGNOSIS — C49A0 Gastrointestinal stromal tumor, unspecified site: Secondary | ICD-10-CM | POA: Diagnosis not present

## 2018-07-16 DIAGNOSIS — E039 Hypothyroidism, unspecified: Secondary | ICD-10-CM | POA: Diagnosis not present

## 2018-07-16 DIAGNOSIS — Z1211 Encounter for screening for malignant neoplasm of colon: Secondary | ICD-10-CM | POA: Diagnosis not present

## 2018-07-16 DIAGNOSIS — I10 Essential (primary) hypertension: Secondary | ICD-10-CM | POA: Diagnosis not present

## 2018-09-28 DIAGNOSIS — H401131 Primary open-angle glaucoma, bilateral, mild stage: Secondary | ICD-10-CM | POA: Diagnosis not present

## 2018-11-30 DIAGNOSIS — H401131 Primary open-angle glaucoma, bilateral, mild stage: Secondary | ICD-10-CM | POA: Diagnosis not present

## 2019-02-17 DIAGNOSIS — R030 Elevated blood-pressure reading, without diagnosis of hypertension: Secondary | ICD-10-CM | POA: Diagnosis not present

## 2019-02-18 ENCOUNTER — Other Ambulatory Visit: Payer: Self-pay | Admitting: Internal Medicine

## 2019-02-18 DIAGNOSIS — Z1231 Encounter for screening mammogram for malignant neoplasm of breast: Secondary | ICD-10-CM

## 2019-05-30 ENCOUNTER — Ambulatory Visit
Admission: RE | Admit: 2019-05-30 | Discharge: 2019-05-30 | Disposition: A | Discharge status: Home / Self Care | Payer: PPO | Source: Ambulatory Visit | Attending: Internal Medicine | Admitting: Internal Medicine

## 2019-05-30 ENCOUNTER — Other Ambulatory Visit: Payer: Self-pay

## 2019-05-30 DIAGNOSIS — Z1231 Encounter for screening mammogram for malignant neoplasm of breast: Secondary | ICD-10-CM

## 2019-06-14 DIAGNOSIS — H401131 Primary open-angle glaucoma, bilateral, mild stage: Secondary | ICD-10-CM | POA: Diagnosis not present

## 2019-06-14 DIAGNOSIS — Z961 Presence of intraocular lens: Secondary | ICD-10-CM | POA: Diagnosis not present

## 2019-07-19 DIAGNOSIS — Z1211 Encounter for screening for malignant neoplasm of colon: Secondary | ICD-10-CM | POA: Diagnosis not present

## 2019-07-19 DIAGNOSIS — I10 Essential (primary) hypertension: Secondary | ICD-10-CM | POA: Diagnosis not present

## 2019-07-19 DIAGNOSIS — E039 Hypothyroidism, unspecified: Secondary | ICD-10-CM | POA: Diagnosis not present

## 2019-07-19 DIAGNOSIS — C499 Malignant neoplasm of connective and soft tissue, unspecified: Secondary | ICD-10-CM | POA: Diagnosis not present

## 2019-08-19 DIAGNOSIS — I129 Hypertensive chronic kidney disease with stage 1 through stage 4 chronic kidney disease, or unspecified chronic kidney disease: Secondary | ICD-10-CM | POA: Diagnosis not present

## 2019-08-19 DIAGNOSIS — E038 Other specified hypothyroidism: Secondary | ICD-10-CM | POA: Diagnosis not present

## 2019-08-19 DIAGNOSIS — Z1331 Encounter for screening for depression: Secondary | ICD-10-CM | POA: Diagnosis not present

## 2019-08-19 DIAGNOSIS — M859 Disorder of bone density and structure, unspecified: Secondary | ICD-10-CM | POA: Diagnosis not present

## 2019-10-12 ENCOUNTER — Ambulatory Visit (INDEPENDENT_AMBULATORY_CARE_PROVIDER_SITE_OTHER): Payer: PPO | Admitting: Dermatology

## 2019-10-12 ENCOUNTER — Encounter: Payer: Self-pay | Admitting: Dermatology

## 2019-10-12 ENCOUNTER — Other Ambulatory Visit: Payer: Self-pay

## 2019-10-12 DIAGNOSIS — L309 Dermatitis, unspecified: Secondary | ICD-10-CM

## 2019-10-12 MED ORDER — HYDROCORTISONE 2.5 % EX CREA: TOPICAL_CREAM | Freq: Every evening | CUTANEOUS | 2 refills | Status: DC

## 2019-10-12 NOTE — Patient Instructions (Signed)
Patient relates a 6+ month history of a dry, red facial rash initially on the temples and spreading on her face.  She has tried a multitude of over-the-counter products including moisturizers, cortisone, and bio oil without much improvement.  Her general health is unaffected and there are no other areas involved.  Examination showed a subtle chronic dermatitis most visible across the upper forehead with lesser involvement on the cheeks.  Brief examination of her back showed no atypical moles nor skin cancer.  I told Cheryl Yoder frankly that I could not distinguish an adult eczema from a contact dermatitis.  She will try generic Hytone ointment nightly for 3 weeks; if there is little improvement, we will proceed to do patch contact testing.  [name] knows that if there is any problem in the interim she could contact me.

## 2019-10-13 NOTE — Progress Notes (Signed)
   New Patient   Subjective  Cheryl Yoder is a 86 y.o. female who presents for the following: Rash (Patient is here today for rash/dryness across her face.  She states it has been like this for several months.  Alot of itching with a burning sensation.  Only has tried OTC moisturizers.).  Rash Location: Face Duration: 6 months Quality: Constant Associated Signs/Symptoms: Redness and peeling Modifying Factors: Multiple over-the-counter lotions including hydrocortisone without improvement Severity: Quite bothersome to patient Timing: Context:    The following portions of the chart were reviewed this encounter and updated as appropriate:     Objective  Well appearing patient in no apparent distress; mood and affect are within normal limits.  A focused examination was performed including face, ears, scalp, neck, back. Relevant physical exam findings are noted in the Assessment and Plan. Patient relates a 6+ month history of a dry, red facial rash initially on the temples and spreading on her face.  She has tried a multitude of over-the-counter products including moisturizers, cortisone, and bio oil without much improvement.  Her general health is unaffected and there are no other areas involved.  Examination showed a subtle chronic dermatitis most visible across the upper forehead with lesser involvement on the cheeks.  Brief examination of her back showed no atypical moles nor skin cancer.  I told Cheryl Yoder frankly that I could not distinguish an adult eczema from a contact dermatitis.  She will try generic Hytone ointment nightly for 3 weeks; if there is little improvement, we will proceed to do patch contact testing.  Cheryl Yoder knows that if there is any problem in the interim she could contact me.   Assessment & Plan  Dermatitis (3) Mid Forehead; Left Forehead; Right Forehead  2.5% generic Hytone ointment qhs for 3-4 weeks; patch test if not improved.  Ordered  Medications: hydrocortisone 2.5 % cream

## 2019-10-14 ENCOUNTER — Ambulatory Visit: Payer: PPO | Admitting: Dermatology

## 2019-11-07 ENCOUNTER — Ambulatory Visit: Payer: PPO

## 2019-11-09 ENCOUNTER — Ambulatory Visit: Payer: PPO

## 2019-11-11 ENCOUNTER — Ambulatory Visit: Payer: PPO | Admitting: Dermatology

## 2019-12-09 DIAGNOSIS — H401131 Primary open-angle glaucoma, bilateral, mild stage: Secondary | ICD-10-CM | POA: Diagnosis not present

## 2020-02-15 ENCOUNTER — Other Ambulatory Visit: Payer: Self-pay | Admitting: Internal Medicine

## 2020-02-15 DIAGNOSIS — I129 Hypertensive chronic kidney disease with stage 1 through stage 4 chronic kidney disease, or unspecified chronic kidney disease: Secondary | ICD-10-CM | POA: Diagnosis not present

## 2020-02-15 DIAGNOSIS — M859 Disorder of bone density and structure, unspecified: Secondary | ICD-10-CM | POA: Diagnosis not present

## 2020-02-15 DIAGNOSIS — E038 Other specified hypothyroidism: Secondary | ICD-10-CM | POA: Diagnosis not present

## 2020-02-15 DIAGNOSIS — M8580 Other specified disorders of bone density and structure, unspecified site: Secondary | ICD-10-CM

## 2020-02-17 ENCOUNTER — Other Ambulatory Visit: Payer: Self-pay

## 2020-02-17 ENCOUNTER — Ambulatory Visit
Admission: RE | Admit: 2020-02-17 | Discharge: 2020-02-17 | Disposition: A | Discharge status: Home / Self Care | Payer: PPO | Source: Ambulatory Visit | Attending: Internal Medicine | Admitting: Internal Medicine

## 2020-02-17 DIAGNOSIS — M8580 Other specified disorders of bone density and structure, unspecified site: Secondary | ICD-10-CM

## 2020-02-17 DIAGNOSIS — M85852 Other specified disorders of bone density and structure, left thigh: Secondary | ICD-10-CM | POA: Diagnosis not present

## 2020-02-17 DIAGNOSIS — Z780 Asymptomatic menopausal state: Secondary | ICD-10-CM | POA: Diagnosis not present

## 2020-03-02 ENCOUNTER — Other Ambulatory Visit: Payer: Self-pay | Admitting: Internal Medicine

## 2020-03-02 DIAGNOSIS — Z1231 Encounter for screening mammogram for malignant neoplasm of breast: Secondary | ICD-10-CM

## 2020-03-29 DIAGNOSIS — M8580 Other specified disorders of bone density and structure, unspecified site: Secondary | ICD-10-CM | POA: Diagnosis not present

## 2020-06-04 ENCOUNTER — Ambulatory Visit
Admission: RE | Admit: 2020-06-04 | Discharge: 2020-06-04 | Disposition: A | Discharge status: Home / Self Care | Payer: PPO | Source: Ambulatory Visit | Attending: Internal Medicine | Admitting: Internal Medicine

## 2020-06-04 ENCOUNTER — Other Ambulatory Visit: Payer: Self-pay

## 2020-06-04 DIAGNOSIS — Z1231 Encounter for screening mammogram for malignant neoplasm of breast: Secondary | ICD-10-CM

## 2020-06-06 DIAGNOSIS — Z961 Presence of intraocular lens: Secondary | ICD-10-CM | POA: Diagnosis not present

## 2020-06-06 DIAGNOSIS — H401131 Primary open-angle glaucoma, bilateral, mild stage: Secondary | ICD-10-CM | POA: Diagnosis not present

## 2020-08-03 DIAGNOSIS — N182 Chronic kidney disease, stage 2 (mild): Secondary | ICD-10-CM | POA: Diagnosis not present

## 2020-08-03 DIAGNOSIS — E039 Hypothyroidism, unspecified: Secondary | ICD-10-CM | POA: Diagnosis not present

## 2020-08-03 DIAGNOSIS — I129 Hypertensive chronic kidney disease with stage 1 through stage 4 chronic kidney disease, or unspecified chronic kidney disease: Secondary | ICD-10-CM | POA: Diagnosis not present

## 2020-09-05 DIAGNOSIS — Z0000 Encounter for general adult medical examination without abnormal findings: Secondary | ICD-10-CM | POA: Diagnosis not present

## 2020-09-05 DIAGNOSIS — Z1331 Encounter for screening for depression: Secondary | ICD-10-CM | POA: Diagnosis not present

## 2020-09-05 DIAGNOSIS — I129 Hypertensive chronic kidney disease with stage 1 through stage 4 chronic kidney disease, or unspecified chronic kidney disease: Secondary | ICD-10-CM | POA: Diagnosis not present

## 2020-09-05 DIAGNOSIS — N182 Chronic kidney disease, stage 2 (mild): Secondary | ICD-10-CM | POA: Diagnosis not present

## 2020-09-05 DIAGNOSIS — E039 Hypothyroidism, unspecified: Secondary | ICD-10-CM | POA: Diagnosis not present

## 2020-09-05 DIAGNOSIS — M8580 Other specified disorders of bone density and structure, unspecified site: Secondary | ICD-10-CM | POA: Diagnosis not present

## 2020-12-05 DIAGNOSIS — E039 Hypothyroidism, unspecified: Secondary | ICD-10-CM | POA: Diagnosis not present

## 2020-12-12 DIAGNOSIS — H401131 Primary open-angle glaucoma, bilateral, mild stage: Secondary | ICD-10-CM | POA: Diagnosis not present

## 2020-12-12 DIAGNOSIS — Z961 Presence of intraocular lens: Secondary | ICD-10-CM | POA: Diagnosis not present

## 2021-01-07 DIAGNOSIS — E039 Hypothyroidism, unspecified: Secondary | ICD-10-CM | POA: Diagnosis not present

## 2021-01-07 DIAGNOSIS — R5383 Other fatigue: Secondary | ICD-10-CM | POA: Diagnosis not present

## 2021-01-07 DIAGNOSIS — I10 Essential (primary) hypertension: Secondary | ICD-10-CM | POA: Diagnosis not present

## 2021-01-07 DIAGNOSIS — R42 Dizziness and giddiness: Secondary | ICD-10-CM | POA: Diagnosis not present

## 2021-01-22 DIAGNOSIS — R42 Dizziness and giddiness: Secondary | ICD-10-CM | POA: Diagnosis not present

## 2021-01-22 DIAGNOSIS — R5383 Other fatigue: Secondary | ICD-10-CM | POA: Diagnosis not present

## 2021-01-22 DIAGNOSIS — I10 Essential (primary) hypertension: Secondary | ICD-10-CM | POA: Diagnosis not present

## 2021-03-08 DIAGNOSIS — M8580 Other specified disorders of bone density and structure, unspecified site: Secondary | ICD-10-CM | POA: Diagnosis not present

## 2021-03-08 DIAGNOSIS — E039 Hypothyroidism, unspecified: Secondary | ICD-10-CM | POA: Diagnosis not present

## 2021-03-08 DIAGNOSIS — I129 Hypertensive chronic kidney disease with stage 1 through stage 4 chronic kidney disease, or unspecified chronic kidney disease: Secondary | ICD-10-CM | POA: Diagnosis not present

## 2021-03-08 DIAGNOSIS — N182 Chronic kidney disease, stage 2 (mild): Secondary | ICD-10-CM | POA: Diagnosis not present

## 2021-03-18 ENCOUNTER — Other Ambulatory Visit: Payer: Self-pay | Admitting: Internal Medicine

## 2021-03-18 DIAGNOSIS — Z1231 Encounter for screening mammogram for malignant neoplasm of breast: Secondary | ICD-10-CM

## 2021-03-19 DIAGNOSIS — N182 Chronic kidney disease, stage 2 (mild): Secondary | ICD-10-CM | POA: Diagnosis not present

## 2021-03-19 DIAGNOSIS — I129 Hypertensive chronic kidney disease with stage 1 through stage 4 chronic kidney disease, or unspecified chronic kidney disease: Secondary | ICD-10-CM | POA: Diagnosis not present

## 2021-03-28 DIAGNOSIS — I10 Essential (primary) hypertension: Secondary | ICD-10-CM | POA: Diagnosis not present

## 2021-06-05 ENCOUNTER — Other Ambulatory Visit: Payer: Self-pay

## 2021-06-05 ENCOUNTER — Ambulatory Visit
Admission: RE | Admit: 2021-06-05 | Discharge: 2021-06-05 | Disposition: A | Discharge status: Home / Self Care | Payer: PPO | Source: Ambulatory Visit | Attending: Internal Medicine | Admitting: Internal Medicine

## 2021-06-05 DIAGNOSIS — Z1231 Encounter for screening mammogram for malignant neoplasm of breast: Secondary | ICD-10-CM

## 2021-07-01 DIAGNOSIS — H903 Sensorineural hearing loss, bilateral: Secondary | ICD-10-CM | POA: Diagnosis not present

## 2021-07-24 DIAGNOSIS — H401131 Primary open-angle glaucoma, bilateral, mild stage: Secondary | ICD-10-CM | POA: Diagnosis not present

## 2021-07-24 DIAGNOSIS — Z961 Presence of intraocular lens: Secondary | ICD-10-CM | POA: Diagnosis not present

## 2021-07-24 DIAGNOSIS — H26491 Other secondary cataract, right eye: Secondary | ICD-10-CM | POA: Diagnosis not present

## 2021-09-19 DIAGNOSIS — M859 Disorder of bone density and structure, unspecified: Secondary | ICD-10-CM | POA: Diagnosis not present

## 2021-09-19 DIAGNOSIS — E039 Hypothyroidism, unspecified: Secondary | ICD-10-CM | POA: Diagnosis not present

## 2021-09-19 DIAGNOSIS — I10 Essential (primary) hypertension: Secondary | ICD-10-CM | POA: Diagnosis not present

## 2021-09-20 DIAGNOSIS — Z1339 Encounter for screening examination for other mental health and behavioral disorders: Secondary | ICD-10-CM | POA: Diagnosis not present

## 2021-09-20 DIAGNOSIS — N182 Chronic kidney disease, stage 2 (mild): Secondary | ICD-10-CM | POA: Diagnosis not present

## 2021-09-20 DIAGNOSIS — I129 Hypertensive chronic kidney disease with stage 1 through stage 4 chronic kidney disease, or unspecified chronic kidney disease: Secondary | ICD-10-CM | POA: Diagnosis not present

## 2021-09-20 DIAGNOSIS — Z1331 Encounter for screening for depression: Secondary | ICD-10-CM | POA: Diagnosis not present

## 2021-09-20 DIAGNOSIS — M8580 Other specified disorders of bone density and structure, unspecified site: Secondary | ICD-10-CM | POA: Diagnosis not present

## 2021-09-20 DIAGNOSIS — Z0000 Encounter for general adult medical examination without abnormal findings: Secondary | ICD-10-CM | POA: Diagnosis not present

## 2021-09-20 DIAGNOSIS — E039 Hypothyroidism, unspecified: Secondary | ICD-10-CM | POA: Diagnosis not present

## 2022-01-28 DIAGNOSIS — H401131 Primary open-angle glaucoma, bilateral, mild stage: Secondary | ICD-10-CM | POA: Diagnosis not present

## 2022-03-02 ENCOUNTER — Emergency Department (HOSPITAL_COMMUNITY)
Admission: EM | Admit: 2022-03-02 | Discharge: 2022-03-02 | Disposition: A | Discharge status: Home / Self Care | Payer: PPO | Attending: Emergency Medicine | Admitting: Emergency Medicine

## 2022-03-02 ENCOUNTER — Encounter (HOSPITAL_COMMUNITY): Payer: Self-pay | Admitting: Emergency Medicine

## 2022-03-02 ENCOUNTER — Emergency Department (HOSPITAL_COMMUNITY): Payer: PPO

## 2022-03-02 DIAGNOSIS — Z7982 Long term (current) use of aspirin: Secondary | ICD-10-CM | POA: Insufficient documentation

## 2022-03-02 DIAGNOSIS — E039 Hypothyroidism, unspecified: Secondary | ICD-10-CM | POA: Diagnosis not present

## 2022-03-02 DIAGNOSIS — R079 Chest pain, unspecified: Secondary | ICD-10-CM | POA: Diagnosis not present

## 2022-03-02 DIAGNOSIS — I10 Essential (primary) hypertension: Secondary | ICD-10-CM | POA: Insufficient documentation

## 2022-03-02 DIAGNOSIS — Z85028 Personal history of other malignant neoplasm of stomach: Secondary | ICD-10-CM | POA: Diagnosis not present

## 2022-03-02 DIAGNOSIS — R0789 Other chest pain: Secondary | ICD-10-CM | POA: Diagnosis not present

## 2022-03-02 DIAGNOSIS — Z79899 Other long term (current) drug therapy: Secondary | ICD-10-CM | POA: Diagnosis not present

## 2022-03-02 LAB — BASIC METABOLIC PANEL WITH GFR
Anion gap: 7 (ref 5–15)
BUN: 10 mg/dL (ref 8–23)
CO2: 25 mmol/L (ref 22–32)
Calcium: 9.9 mg/dL (ref 8.9–10.3)
Chloride: 101 mmol/L (ref 98–111)
Creatinine, Ser: 0.8 mg/dL (ref 0.44–1.00)
GFR, Estimated: >60 mL/min
Glucose, Bld: 106 mg/dL — ABNORMAL HIGH (ref 70–99)
Potassium: 4.3 mmol/L (ref 3.5–5.1)
Sodium: 133 mmol/L — ABNORMAL LOW (ref 135–145)

## 2022-03-02 LAB — CBC
HCT: 37.4 % (ref 36.0–46.0)
Hemoglobin: 12.5 g/dL (ref 12.0–15.0)
MCH: 29.1 pg (ref 26.0–34.0)
MCHC: 33.4 g/dL (ref 30.0–36.0)
MCV: 87 fL (ref 80.0–100.0)
Platelets: 307 10*3/uL (ref 150–400)
RBC: 4.3 MIL/uL (ref 3.87–5.11)
RDW: 12.3 % (ref 11.5–15.5)
WBC: 7.5 10*3/uL (ref 4.0–10.5)
nRBC: 0 % (ref 0.0–0.2)

## 2022-03-02 LAB — TROPONIN I (HIGH SENSITIVITY)
Troponin I (High Sensitivity): 6 ng/L
Troponin I (High Sensitivity): 6 ng/L (ref <18)

## 2022-03-02 MED ORDER — NITROGLYCERIN 0.4 MG SL SUBL
0.4000 mg | SUBLINGUAL_TABLET | SUBLINGUAL | Status: DC | PRN
  Administered 2022-03-02 (×2): 0.4 mg via SUBLINGUAL
  Filled 2022-03-02: qty 1

## 2022-03-02 MED ORDER — ASPIRIN 81 MG PO CHEW
162.0000 mg | CHEWABLE_TABLET | Freq: Once | ORAL | Status: AC
  Administered 2022-03-02: 162 mg via ORAL
  Filled 2022-03-02: qty 2

## 2022-03-02 MED ORDER — LACTATED RINGERS IV BOLUS
500.0000 mL | Freq: Once | INTRAVENOUS | Status: AC
  Administered 2022-03-02: 500 mL via INTRAVENOUS

## 2022-03-02 NOTE — Discharge Instructions (Addendum)
Today you were seen in the emergency department for your chest pain.    In the emergency department you had an EKG and blood work including troponins that were reassuring.    Follow-up with your primary doctor in 2-3 days regarding your visit and discuss having a stress test with them.  We have also given you cardiology referral as well.  Return immediately to the emergency department if you experience any of the following: Recurrence of chest pain, unexplained sweating, shortness of breath, vomiting, or any other concerning symptoms.    Thank you for visiting our Emergency Department. It was a pleasure taking care of you today.

## 2022-03-02 NOTE — ED Triage Notes (Signed)
Patient complains of a dull ache in the left of her chest that started at 0630 this morning after waking. Patient denies shortness of breath, dizziness, and nausea. Patient is alert, oriented, and in no apparent distress at this time.

## 2022-03-02 NOTE — ED Notes (Signed)
Pt provided discharge instructions and prescription information. Pt was given the opportunity to ask questions and questions were answered.   

## 2022-03-02 NOTE — ED Provider Notes (Signed)
[facility] EMERGENCY DEPARTMENT Provider Note   CSN: [id number] Arrival date & time: 03/02/22  1026     History  Chief Complaint  Patient presents with   Chest Pain    Cheryl Yoder is a 88 y.o. female.  89 yo F with history of hypertension who presents with chest pain.  Patient states that she woke this morning and walked approximately 20 minutes.  She says that around 630 she started having left-sided chest pressure.  Says that it was mild but has persisted.  Denies any diaphoresis, radiation of the pain, exertional component, pleuritic component, shortness of breath or cough recently.  No lower extremity swelling or pain.  No history of DVT, PE, or MI.  No immediate family members with a history of MI.  Denies smoking history.  Additional history obtained per the daughter states that she has severe whitecoat hypertension.  States that she did take her blood pressure medication this morning which she believes is 100 mg of losartan and oral hydralazine.  When her mother was having the chest discomfort she did give her 162 mg of baby aspirin which the patient reports improved the pain some.  Has not tried any nitroglycerin yet.  Denies any numbness or weakness of her arms or legs or radiation to her back.  No injuries to the area.   Chest Pain  Past Medical History:  Diagnosis Date   Cancer [facility])    gist   GIST (gastrointestinal stromal tumor), malignant ([place])    Hypertension    Hypothyroid       Home Medications Prior to Admission medications   Medication Sig Start Date End Date Taking? Authorizing Provider  aspirin 81 MG tablet Take 81 mg by mouth daily.    [provider]  calcium-vitamin D (OSCAL) 250-125 MG-UNIT per tablet Take 1 tablet by mouth daily.      [provider]  carvedilol (COREG) 6.25 MG tablet Take 6.25 mg by mouth 2 (two) times daily with a meal.    [provider]  doxazosin (CARDURA) 2 MG tablet Take 2 mg by  mouth at bedtime. Takes 1 total dose 3 mg    [provider]  ferrous sulfate 325 (65 FE) MG tablet Take 1 tablet (325 mg total) by mouth 3 (three) times daily after meals. 12/05/12   Porterfield, Amber, PA-C  hydrocortisone 2.5 % cream Apply topically every evening. Apply to face every evening for 3-4 weeks. 10/12/19   Lavonna Monarch, MD  levothyroxine (SYNTHROID, LEVOTHROID) 75 MCG tablet Take 75 mcg by mouth every morning.     [provider]  losartan (COZAAR) 100 MG tablet Take 100 mg by mouth at bedtime.     [provider]      Allergies    Ciprocinonide [fluocinolone], Codeine sulfate, Penicillins, and Sulfonamide derivatives    Review of Systems   Review of Systems  Cardiovascular:  Positive for chest pain.    Physical Exam Updated Vital Signs BP (!) 183/57   Pulse 62   Temp 97.8 F (36.6 C) (Oral)   Resp 15   SpO2 99%  Physical Exam Vitals and nursing note reviewed.  Constitutional:      General: She is not in acute distress.    Appearance: She is well-developed.  HENT:     Head: Normocephalic and atraumatic.     Right Ear: External ear normal.     Left Ear: External ear normal.     Nose: Nose  normal.     Mouth/Throat:     Mouth: Mucous membranes are moist.     Pharynx: Oropharynx is clear.  Eyes:     Extraocular Movements: Extraocular movements intact.     Conjunctiva/sclera: Conjunctivae normal.     Pupils: Pupils are equal, round, and reactive to light.  Cardiovascular:     Rate and Rhythm: Normal rate and regular rhythm.     Heart sounds: No murmur heard.    Comments: Radial pulses 2+ bilaterally Pulmonary:     Effort: Pulmonary effort is normal. No respiratory distress.     Breath sounds: Normal breath sounds.  Abdominal:     General: Abdomen is flat. There is no distension.     Palpations: Abdomen is soft. There is no mass.     Tenderness: There is no abdominal tenderness. There is no guarding.  Musculoskeletal:         General: No swelling.     Cervical back: Normal range of motion and neck supple.     Right lower leg: No edema.     Left lower leg: No edema.  Skin:    General: Skin is warm and dry.     Capillary Refill: Capillary refill takes less than 2 seconds.  Neurological:     Mental Status: She is alert and oriented to person, place, and time. Mental status is at baseline.  Psychiatric:        Mood and Affect: Mood normal.     ED Results / Procedures / Treatments   Labs (all labs ordered are listed, but only abnormal results are displayed) Labs Reviewed  BASIC METABOLIC PANEL - Abnormal; Notable for the following components:      Result Value   Sodium 133 (*)    Glucose, Bld 106 (*)    All other components within normal limits  CBC  TROPONIN I (HIGH SENSITIVITY)  TROPONIN I (HIGH SENSITIVITY)    EKG EKG Interpretation  Date/Time:  Sunday March 02 2022 11:45:07 EDT Ventricular Rate:  69 PR Interval:  189 QRS Duration: 91 QT Interval:  395 QTC Calculation: 424 R Axis:   52 Text Interpretation: Sinus rhythm Low voltage, precordial leads Confirmed by Margaretmary Eddy [phone]) on 03/02/2022 2:28:54 PM  Radiology DG Chest 2 View  Result Date: 03/02/2022 CLINICAL DATA:  Chest pain EXAM: CHEST - 2 VIEW COMPARISON:  11/24/2012 FINDINGS: The heart size and mediastinal contours are within normal limits. Aortic atherosclerosis. Both lungs are clear. The visualized skeletal structures are unremarkable. IMPRESSION: No active cardiopulmonary disease. Electronically Signed   By: Davina Poke D.O.   On: 03/02/2022 11:12    Procedures Procedures   Medications Ordered in ED Medications  aspirin chewable tablet 162 mg (162 mg Oral Given 03/02/22 1155)  lactated ringers bolus 500 mL ( Intravenous Stopped 03/02/22 1341)    ED Course/ Medical Decision Making/ A&P Clinical Course as of 03/02/22 2044  Sun Mar 02, 2022  1217 BP(!): 141/66 Significantly improved after the nitroglycerin.  [RP]   1429 Heart Score of 4.  [RP]    Clinical Course User Index [RP] Cheryl Meadow, MD                           Medical Decision Making Amount and/or Complexity of Data Reviewed Labs: ordered. Radiology: ordered.  Risk OTC drugs. Prescription drug management.   88 yo F with history of hypertension who presents with chest pain.   Initial DDx:  Hypertensive emergency, MI, musculoskeletal pain, pneumonia  Plan:  Labs Troponin Nitroglycerin EKG Chest x-ray  ED Summary:  Patient was in the emergency department she was noted to be significantly hypertensive into the 200s.  She was given nitroglycerin for chest discomfort.  She reported this mildly improved her symptoms.  While in the emergency department her blood pressure spontaneously reduced into the 093 systolic so she did not require any antihypertensives.  Her troponins were stable at 6.  EKG did not show any concerning changes.  Patient was given a heart score of 4 for a moderately suspicious story, her age, and 1-2 risk factors.  Discussed admission versus outpatient management with the patient and her daughter and they preferred to sue outpatient management at this time.  Discussed with them how they should talk to the primary doctor about stress testing.  Return precautions discussed with the patient and her daughter prior to discharge.  Additional history obtained from family Records reviewed previous admission documents and Care Everywhere/External Records   I independently visualized the following imaging with scope of interpretation limited to determining acute life threatening conditions related to emergency care: Chest x-ray, which revealed no acute abnormalities  Final Clinical Impression(s) / ED Diagnoses Final diagnoses:  Other chest pain  Primary hypertension    Rx / DC Orders ED Discharge Orders          Ordered    Ambulatory referral to Cardiology       Comments: Chest pain, poorly controlled  hypertension, requesting stress test   03/02/22 1435              Cheryl Meadow, MD 03/02/22 2045

## 2022-03-06 DIAGNOSIS — M8580 Other specified disorders of bone density and structure, unspecified site: Secondary | ICD-10-CM | POA: Diagnosis not present

## 2022-03-06 DIAGNOSIS — R079 Chest pain, unspecified: Secondary | ICD-10-CM | POA: Diagnosis not present

## 2022-03-06 DIAGNOSIS — I129 Hypertensive chronic kidney disease with stage 1 through stage 4 chronic kidney disease, or unspecified chronic kidney disease: Secondary | ICD-10-CM | POA: Diagnosis not present

## 2022-03-12 ENCOUNTER — Other Ambulatory Visit: Payer: Self-pay | Admitting: Internal Medicine

## 2022-03-12 DIAGNOSIS — M8580 Other specified disorders of bone density and structure, unspecified site: Secondary | ICD-10-CM

## 2022-04-13 NOTE — Progress Notes (Signed)
Chief Complaint  Patient presents with   New Patient (Initial Visit)    Chest pain   History of Present Illness: 88 yo female with history of gastric cancer, HTN, hypothyroidism and CKD who is here today as a new patient for the evaluation of chest pain. She was seen in the ED with chest pain 03/02/22 in the setting of elevated BP. Troponin negative x 2. No ischemic EKG changes. She tells me today that she feels great. She has had no recurrent chest pain. No dyspnea, dizziness, near syncope Her gastric tumor was treated with resection and has been felt to be in remission. She has had trouble tolerating HTN medications. She has tolerated Norvasc and Cozaar but she has been feeling fatigued since starting Norvasc.   Primary Care Physician: Michael Boston, MD   Past Medical History:  Diagnosis Date   Cancer [facility])    gist   Cataract    Chest pain    GIST (gastrointestinal stromal tumor), malignant ([place])    Glaucoma    Hypertension    Hypothyroid    Kidney disease    Sensorineural hearing loss     Past Surgical History:  Procedure Laterality Date   ABDOMINAL HYSTERECTOMY     ESOPHAGOGASTRODUODENOSCOPY  08/14/2011   Procedure: ESOPHAGOGASTRODUODENOSCOPY (EGD);  Surgeon: Owens Loffler, MD;  Location: [facility];  Service: Endoscopy;  Laterality: N/A;   EUS  08/14/2011   Procedure: UPPER ENDOSCOPIC ULTRASOUND (EUS) LINEAR;  Surgeon: Owens Loffler, MD;  Location: [facility];  Service: Endoscopy;  Laterality: N/A;   STOMACH SURGERY  2006   gist tumor removed   TOTAL HIP ARTHROPLASTY Right 12/02/2012   Procedure: RIGHT TOTAL HIP ARTHROPLASTY;  Surgeon: Tobi Bastos, MD;  Location: [facility];  Service: Orthopedics;  Laterality: Right;    Current Outpatient Medications  Medication Sig Dispense Refill   amLODipine (NORVASC) 2.5 MG tablet Take 1 tablet by mouth daily.     aspirin 81 MG tablet Take 81 mg by mouth daily.     calcium-vitamin D (OSCAL) 250-125 MG-UNIT per tablet Take 1  tablet by mouth daily.       latanoprost (XALATAN) 0.005 % ophthalmic solution Place 1 drop into both eyes daily.     levothyroxine (SYNTHROID, LEVOTHROID) 75 MCG tablet Take 75 mcg by mouth every morning.      losartan (COZAAR) 100 MG tablet Take 100 mg by mouth at bedtime.      No current facility-administered medications for this visit.    Allergies  Allergen Reactions   Ciprocinonide [Fluocinolone] Nausea And Vomiting    Causes N/V and diarrhea   Codeine Sulfate Nausea And Vomiting   Penicillins     unknown   Sulfonamide Derivatives Rash    Social History   Socioeconomic History   Marital status: Widowed    Spouse name: Not on file   Number of children: 2   Years of education: Not on file   Highest education level: Not on file  Occupational History   Occupation: Retired    Fish farm manager: RETIRED  Tobacco Use   Smoking status: Never   Smokeless tobacco: Never  Vaping Use   Vaping Use: Never used  Substance and Sexual Activity   Alcohol use: No   Drug use: No   Sexual activity: Not on file  Other Topics Concern   Not on file  Social History Narrative   Not on file   Social Determinants of Health   Financial Resource Strain:  Not on file  Food Insecurity: Not on file  Transportation Needs: Not on file  Physical Activity: Not on file  Stress: Not on file  Social Connections: Not on file  Intimate Partner Violence: Not on file    Family History  Problem Relation Age of Onset   Diabetes Mother    Rheum arthritis Mother    Arthritis Sister    Diabetes Son    Arthritis Son    Cancer Cousin     Review of Systems:  As stated in the HPI and otherwise negative.   BP (!) 158/70   Pulse 69   Ht '5\' 3"'$  (1.6 m)   Wt 113 lb 9.6 oz (51.5 kg)   SpO2 98%   BMI 20.12 kg/m   Physical Examination: General: Well developed, well nourished, NAD  HEENT: OP clear, mucus membranes moist  SKIN: warm, dry. No rashes. Neuro: No focal deficits  Musculoskeletal: Muscle  strength 5/5 all ext  Psychiatric: Mood and affect normal  Neck: No JVD, no carotid bruits, no thyromegaly, no lymphadenopathy.  Lungs:Clear bilaterally, no wheezes, rhonci, crackles Cardiovascular: Regular rate and rhythm. No murmurs, gallops or rubs. Abdomen:Soft. Bowel sounds present. Non-tender.  Extremities: No lower extremity edema. Pulses are 2 + in the bilateral DP/PT.  EKG:  EKG is not ordered today. The ekg from 03/02/22 is reviewed and shows sinus  Recent Labs: 03/02/2022: BUN 10; Creatinine, Ser 0.80; Hemoglobin 12.5; Platelets 307; Potassium 4.3; Sodium 133   Lipid Panel No results found for: "CHOL", "TRIG", "HDL", "CHOLHDL", "VLDL", "LDLCALC", "LDLDIRECT"   Wt Readings from Last 3 Encounters:  04/14/22 113 lb 9.6 oz (51.5 kg)  05/31/13 151 lb 12.8 oz (68.9 kg)  12/02/12 150 lb (68 kg)     Assessment and Plan:   1. Chest pain: No recurrence of pain over the past month. She does not wish to have any ischemic testing at her age.   2. HTN: BP is elevated today. Has been controlled at home. No changes but she will hold her Norvasc for several days to see if her fatigue improves. I think at her age a SBP under 160 is ok since she feels poorly on the Norvasc and has not tolerated Coreg, HCTZ or hydralazine due to hypotension.   3. Fatigue: Will try holding Norvasc and see if her energy level improves. We discussed an echo but she does not wish to arrange this.   Labs/ tests ordered today include:  No orders of the defined types were placed in this encounter.  Disposition:   F/U with me as needed   Signed, Lauree Chandler, MD, [facility] 04/14/2022 10:06 AM    [address], [place], [place]  [address] Phone: [phone]; Fax: [phone]

## 2022-04-14 ENCOUNTER — Ambulatory Visit: Payer: PPO | Attending: Cardiovascular Disease | Admitting: Cardiovascular Disease

## 2022-04-14 ENCOUNTER — Encounter: Payer: Self-pay | Admitting: Cardiovascular Disease

## 2022-04-14 VITALS — BP 158/70 | HR 69 | Ht 63.0 in | Wt 113.6 lb

## 2022-04-14 DIAGNOSIS — I10 Essential (primary) hypertension: Secondary | ICD-10-CM

## 2022-04-14 DIAGNOSIS — R079 Chest pain, unspecified: Secondary | ICD-10-CM

## 2022-04-14 NOTE — Patient Instructions (Signed)
Medication Instructions:  No changes *If you need a refill on your cardiac medications before your next appointment, please call your pharmacy*   Lab Work: none If you have labs (blood work) drawn today and your tests are completely normal, you will receive your results only by: [place] (if you have MyChart) OR A paper copy in the mail If you have any lab test that is abnormal or we need to change your treatment, we will call you to review the results.   Testing/Procedures: none   Follow-Up: As needed with Dr. Angelena Form  Important Information About Sugar

## 2022-04-25 DIAGNOSIS — R5383 Other fatigue: Secondary | ICD-10-CM | POA: Diagnosis not present

## 2022-04-25 DIAGNOSIS — I129 Hypertensive chronic kidney disease with stage 1 through stage 4 chronic kidney disease, or unspecified chronic kidney disease: Secondary | ICD-10-CM | POA: Diagnosis not present

## 2022-04-25 DIAGNOSIS — E039 Hypothyroidism, unspecified: Secondary | ICD-10-CM | POA: Diagnosis not present

## 2022-04-25 DIAGNOSIS — R079 Chest pain, unspecified: Secondary | ICD-10-CM | POA: Diagnosis not present

## 2022-04-25 DIAGNOSIS — N182 Chronic kidney disease, stage 2 (mild): Secondary | ICD-10-CM | POA: Diagnosis not present

## 2022-05-05 ENCOUNTER — Emergency Department (HOSPITAL_COMMUNITY): Payer: PPO

## 2022-05-05 ENCOUNTER — Observation Stay (HOSPITAL_COMMUNITY): Payer: PPO

## 2022-05-05 ENCOUNTER — Encounter (HOSPITAL_COMMUNITY): Payer: Self-pay

## 2022-05-05 ENCOUNTER — Observation Stay (HOSPITAL_COMMUNITY)
Admission: EM | Admit: 2022-05-05 | Discharge: 2022-05-06 | Disposition: A | Discharge status: Home / Self Care | Payer: PPO | Attending: Internal Medicine | Admitting: Internal Medicine

## 2022-05-05 DIAGNOSIS — I10 Essential (primary) hypertension: Secondary | ICD-10-CM | POA: Diagnosis present

## 2022-05-05 DIAGNOSIS — Z20822 Contact with and (suspected) exposure to covid-19: Secondary | ICD-10-CM | POA: Diagnosis not present

## 2022-05-05 DIAGNOSIS — Z85028 Personal history of other malignant neoplasm of stomach: Secondary | ICD-10-CM | POA: Diagnosis not present

## 2022-05-05 DIAGNOSIS — G459 Transient cerebral ischemic attack, unspecified: Secondary | ICD-10-CM | POA: Diagnosis present

## 2022-05-05 DIAGNOSIS — N189 Chronic kidney disease, unspecified: Secondary | ICD-10-CM | POA: Diagnosis not present

## 2022-05-05 DIAGNOSIS — Z66 Do not resuscitate: Secondary | ICD-10-CM | POA: Diagnosis not present

## 2022-05-05 DIAGNOSIS — Z96641 Presence of right artificial hip joint: Secondary | ICD-10-CM | POA: Insufficient documentation

## 2022-05-05 DIAGNOSIS — R531 Weakness: Secondary | ICD-10-CM | POA: Diagnosis not present

## 2022-05-05 DIAGNOSIS — I6523 Occlusion and stenosis of bilateral carotid arteries: Secondary | ICD-10-CM | POA: Diagnosis not present

## 2022-05-05 DIAGNOSIS — I639 Cerebral infarction, unspecified: Principal | ICD-10-CM | POA: Insufficient documentation

## 2022-05-05 DIAGNOSIS — G939 Disorder of brain, unspecified: Secondary | ICD-10-CM | POA: Diagnosis not present

## 2022-05-05 DIAGNOSIS — E039 Hypothyroidism, unspecified: Secondary | ICD-10-CM | POA: Insufficient documentation

## 2022-05-05 DIAGNOSIS — M852 Hyperostosis of skull: Secondary | ICD-10-CM | POA: Diagnosis not present

## 2022-05-05 DIAGNOSIS — I6782 Cerebral ischemia: Secondary | ICD-10-CM | POA: Diagnosis not present

## 2022-05-05 DIAGNOSIS — Z79899 Other long term (current) drug therapy: Secondary | ICD-10-CM | POA: Diagnosis not present

## 2022-05-05 DIAGNOSIS — H409 Unspecified glaucoma: Secondary | ICD-10-CM | POA: Diagnosis present

## 2022-05-05 DIAGNOSIS — I129 Hypertensive chronic kidney disease with stage 1 through stage 4 chronic kidney disease, or unspecified chronic kidney disease: Secondary | ICD-10-CM | POA: Insufficient documentation

## 2022-05-05 DIAGNOSIS — I6503 Occlusion and stenosis of bilateral vertebral arteries: Secondary | ICD-10-CM | POA: Diagnosis not present

## 2022-05-05 DIAGNOSIS — Z7982 Long term (current) use of aspirin: Secondary | ICD-10-CM | POA: Insufficient documentation

## 2022-05-05 DIAGNOSIS — R29818 Other symptoms and signs involving the nervous system: Secondary | ICD-10-CM | POA: Diagnosis not present

## 2022-05-05 DIAGNOSIS — R4701 Aphasia: Secondary | ICD-10-CM | POA: Diagnosis not present

## 2022-05-05 LAB — URINALYSIS, ROUTINE W REFLEX MICROSCOPIC
Bilirubin Urine: NEGATIVE
Glucose, UA: NEGATIVE mg/dL
Hgb urine dipstick: NEGATIVE
Ketones, ur: NEGATIVE mg/dL
Leukocytes,Ua: NEGATIVE
Nitrite: NEGATIVE
Protein, ur: NEGATIVE mg/dL
Specific Gravity, Urine: 1.011 (ref 1.005–1.030)
pH: 9 — ABNORMAL HIGH (ref 5.0–8.0)

## 2022-05-05 LAB — CBC WITH DIFFERENTIAL/PLATELET
Abs Immature Granulocytes: 0.02 10*3/uL (ref 0.00–0.07)
Basophils Absolute: 0 10*3/uL (ref 0.0–0.1)
Basophils Relative: 1 %
Eosinophils Absolute: 0 10*3/uL (ref 0.0–0.5)
Eosinophils Relative: 1 %
HCT: 37.7 % (ref 36.0–46.0)
Hemoglobin: 13.2 g/dL (ref 12.0–15.0)
Immature Granulocytes: 0 %
Lymphocytes Relative: 21 %
Lymphs Abs: 1.2 10*3/uL (ref 0.7–4.0)
MCH: 30.2 pg (ref 26.0–34.0)
MCHC: 35 g/dL (ref 30.0–36.0)
MCV: 86.3 fL (ref 80.0–100.0)
Monocytes Absolute: 0.3 10*3/uL (ref 0.1–1.0)
Monocytes Relative: 5 %
Neutro Abs: 4 10*3/uL (ref 1.7–7.7)
Neutrophils Relative %: 72 %
Platelets: 272 10*3/uL (ref 150–400)
RBC: 4.37 MIL/uL (ref 3.87–5.11)
RDW: 12.4 % (ref 11.5–15.5)
WBC: 5.5 10*3/uL (ref 4.0–10.5)
nRBC: 0 % (ref 0.0–0.2)

## 2022-05-05 LAB — COMPREHENSIVE METABOLIC PANEL
ALT: 13 U/L (ref 0–44)
AST: 22 U/L (ref 15–41)
Albumin: 3.5 g/dL (ref 3.5–5.0)
Alkaline Phosphatase: 55 U/L (ref 38–126)
Anion gap: 8 (ref 5–15)
BUN: 17 mg/dL (ref 8–23)
CO2: 24 mmol/L (ref 22–32)
Calcium: 9.8 mg/dL (ref 8.9–10.3)
Chloride: 101 mmol/L (ref 98–111)
Creatinine, Ser: 0.81 mg/dL (ref 0.44–1.00)
GFR, Estimated: >60 mL/min (ref >60)
Glucose, Bld: 107 mg/dL — ABNORMAL HIGH (ref 70–99)
Potassium: 4.3 mmol/L (ref 3.5–5.1)
Sodium: 133 mmol/L — ABNORMAL LOW (ref 135–145)
Total Bilirubin: 0.6 mg/dL (ref 0.3–1.2)
Total Protein: 6.4 g/dL — ABNORMAL LOW (ref 6.5–8.1)

## 2022-05-05 LAB — I-STAT CHEM 8, ED
BUN: 19 mg/dL (ref 8–23)
Calcium, Ion: 1.23 mmol/L (ref 1.15–1.40)
Chloride: 98 mmol/L (ref 98–111)
Creatinine, Ser: 0.8 mg/dL (ref 0.44–1.00)
Glucose, Bld: 102 mg/dL — ABNORMAL HIGH (ref 70–99)
HCT: 38 % (ref 36.0–46.0)
Hemoglobin: 12.9 g/dL (ref 12.0–15.0)
Potassium: 4.4 mmol/L (ref 3.5–5.1)
Sodium: 133 mmol/L — ABNORMAL LOW (ref 135–145)
TCO2: 25 mmol/L (ref 22–32)

## 2022-05-05 LAB — RESP PANEL BY RT-PCR (FLU A&B, COVID) ARPGX2
Influenza A by PCR: NEGATIVE
Influenza B by PCR: NEGATIVE
SARS Coronavirus 2 by RT PCR: NEGATIVE

## 2022-05-05 LAB — PROTIME-INR
INR: 1 (ref 0.8–1.2)
Prothrombin Time: 13.1 seconds (ref 11.4–15.2)

## 2022-05-05 LAB — CBG MONITORING, ED: Glucose-Capillary: 106 mg/dL — ABNORMAL HIGH (ref 70–99)

## 2022-05-05 LAB — HEMOGLOBIN A1C
Hgb A1c MFr Bld: 5.3 % (ref 4.8–5.6)
Mean Plasma Glucose: 105.41 mg/dL

## 2022-05-05 LAB — TROPONIN I (HIGH SENSITIVITY): Troponin I (High Sensitivity): 5 ng/L (ref <18)

## 2022-05-05 MED ORDER — ACETAMINOPHEN 160 MG/5ML PO SOLN: 650.0000 mg | ORAL | Status: DC | PRN

## 2022-05-05 MED ORDER — ACETAMINOPHEN 650 MG RE SUPP: 650.0000 mg | RECTAL | Status: DC | PRN

## 2022-05-05 MED ORDER — HYDRALAZINE HCL 20 MG/ML IJ SOLN
5.0000 mg | INTRAMUSCULAR | Status: DC | PRN
  Administered 2022-05-05: 5 mg via INTRAVENOUS
  Filled 2022-05-05: qty 1

## 2022-05-05 MED ORDER — ASPIRIN 325 MG PO TABS
325.0000 mg | ORAL_TABLET | Freq: Once | ORAL | Status: AC
  Administered 2022-05-05: 325 mg via ORAL

## 2022-05-05 MED ORDER — LORAZEPAM 2 MG/ML IJ SOLN: 0.5000 mg | Freq: Once | INTRAMUSCULAR | Status: DC | PRN

## 2022-05-05 MED ORDER — STROKE: EARLY STAGES OF RECOVERY BOOK
Freq: Once | Status: AC
  Filled 2022-05-05: qty 1

## 2022-05-05 MED ORDER — LATANOPROST 0.005 % OP SOLN
1.0000 [drp] | Freq: Every day | OPHTHALMIC | Status: DC
  Administered 2022-05-05: 1 [drp] via OPHTHALMIC
  Filled 2022-05-05: qty 2.5

## 2022-05-05 MED ORDER — LEVOTHYROXINE SODIUM 75 MCG PO TABS
75.0000 ug | ORAL_TABLET | Freq: Every morning | ORAL | Status: DC
  Administered 2022-05-05 – 2022-05-06 (×2): 75 ug via ORAL
  Filled 2022-05-05 (×2): qty 1

## 2022-05-05 MED ORDER — ASPIRIN 300 MG RE SUPP: 300.0000 mg | Freq: Every day | RECTAL | Status: DC

## 2022-05-05 MED ORDER — ASPIRIN 81 MG PO TBEC
81.0000 mg | DELAYED_RELEASE_TABLET | Freq: Every day | ORAL | Status: DC
  Administered 2022-05-06: 81 mg via ORAL
  Filled 2022-05-05: qty 1

## 2022-05-05 MED ORDER — ENOXAPARIN SODIUM 40 MG/0.4ML IJ SOSY
40.0000 mg | PREFILLED_SYRINGE | Freq: Every day | INTRAMUSCULAR | Status: DC
  Administered 2022-05-05 – 2022-05-06 (×2): 40 mg via SUBCUTANEOUS
  Filled 2022-05-05 (×2): qty 0.4

## 2022-05-05 MED ORDER — ASPIRIN 325 MG PO TABS
325.0000 mg | ORAL_TABLET | Freq: Every day | ORAL | Status: DC
  Filled 2022-05-05: qty 1

## 2022-05-05 MED ORDER — SENNOSIDES-DOCUSATE SODIUM 8.6-50 MG PO TABS: 1.0000 | ORAL_TABLET | Freq: Every evening | ORAL | Status: DC | PRN

## 2022-05-05 MED ORDER — ASPIRIN 300 MG RE SUPP: 300.0000 mg | Freq: Once | RECTAL | Status: AC

## 2022-05-05 MED ORDER — IOHEXOL 350 MG/ML SOLN
100.0000 mL | Freq: Once | INTRAVENOUS | Status: AC | PRN
  Administered 2022-05-05: 75 mL via INTRAVENOUS

## 2022-05-05 MED ORDER — ACETAMINOPHEN 325 MG PO TABS: 650.0000 mg | ORAL_TABLET | ORAL | Status: DC | PRN

## 2022-05-05 NOTE — ED Triage Notes (Signed)
Pt bib EMS, daughter called because pt was not able to speak or communicate, only able to nod her head. On arrival, pt now able to speak and communicate. Aox4, no hx of recent falls.

## 2022-05-05 NOTE — ED Provider Notes (Signed)
[facility] EMERGENCY DEPARTMENT Provider Note   CSN: [id number] Arrival date & time: 05/05/22  2774     History  Chief Complaint  Patient presents with   Aphasia    Cheryl Yoder is a 88 y.o. female.  88 year old female with prior medical history as detailed below presents for evaluation.  She arrives by EMS from home.  Her daughter is present at bedside.  Patient resides with daughter.  Patient apparently woke up around 530 or 6 this morning.  She was in her normal state of health.  Shortly thereafter she had a period where she felt very fatigued and weak.  Her daughter noticed that she was having trouble getting her words out.  EMS reports that symptoms resolved completely upon transport.  EMS reports mild to moderate aphasia on their initial evaluation.  Again, patient is at baseline mental status upon arrival.  This is confirmed by daughter at bedside.  The history is provided by the patient and medical records.       Home Medications Prior to Admission medications   Medication Sig Start Date End Date Taking? Authorizing Provider  acetaminophen (TYLENOL) 500 MG tablet Take 1,000 mg by mouth every 6 (six) hours as needed for mild pain.   Yes [provider]  aspirin 81 MG tablet Take 81 mg by mouth daily.   Yes [provider]  calcium-vitamin D (OSCAL) 250-125 MG-UNIT per tablet Take 1 tablet by mouth daily.     Yes [provider]  hydrALAZINE (APRESOLINE) 25 MG tablet Take 25 mg by mouth daily.   Yes [provider]  latanoprost (XALATAN) 0.005 % ophthalmic solution Place 1 drop into both eyes at bedtime. 01/19/22  Yes [provider]  levothyroxine (SYNTHROID, LEVOTHROID) 75 MCG tablet Take 75 mcg by mouth every morning.    Yes [provider]  losartan (COZAAR) 100 MG tablet Take 100 mg by mouth at bedtime.    Yes [provider]      Allergies    Ciprocinonide [fluocinolone], Codeine  sulfate, Penicillins, and Sulfonamide derivatives    Review of Systems   Review of Systems  All other systems reviewed and are negative.   Physical Exam Updated Vital Signs BP (!) 194/65   Pulse 62   Temp (!) 97.4 F (36.3 C) (Oral)   Resp 15   Ht '5\' 4"'$  (1.626 m)   Wt 52.2 kg   SpO2 99%   BMI 19.74 kg/m  Physical Exam Vitals and nursing note reviewed.  Constitutional:      General: She is not in acute distress.    Appearance: Normal appearance. She is well-developed.  HENT:     Head: Normocephalic and atraumatic.  Eyes:     Conjunctiva/sclera: Conjunctivae normal.     Pupils: Pupils are equal, round, and reactive to light.  Cardiovascular:     Rate and Rhythm: Normal rate and regular rhythm.     Heart sounds: Normal heart sounds.  Pulmonary:     Effort: Pulmonary effort is normal. No respiratory distress.     Breath sounds: Normal breath sounds.  Abdominal:     General: There is no distension.     Palpations: Abdomen is soft.     Tenderness: There is no abdominal tenderness.  Musculoskeletal:        General: No deformity. Normal range of motion.     Cervical back: Normal range of motion and neck supple.  Skin:    General: Skin  is warm and dry.  Neurological:     General: No focal deficit present.     Mental Status: She is alert and oriented to person, place, and time. Mental status is at baseline.     Cranial Nerves: No cranial nerve deficit.     Sensory: No sensory deficit.     Motor: No weakness.     Coordination: Coordination normal.     ED Results / Procedures / Treatments   Labs (all labs ordered are listed, but only abnormal results are displayed) Labs Reviewed  COMPREHENSIVE METABOLIC PANEL - Abnormal; Notable for the following components:      Result Value   Sodium 133 (*)    Glucose, Bld 107 (*)    Total Protein 6.4 (*)    All other components within normal limits  URINALYSIS, ROUTINE W REFLEX MICROSCOPIC - Abnormal; Notable for the following  components:   Color, Urine STRAW (*)    pH 9.0 (*)    All other components within normal limits  I-STAT CHEM 8, ED - Abnormal; Notable for the following components:   Sodium 133 (*)    Glucose, Bld 102 (*)    All other components within normal limits  CBG MONITORING, ED - Abnormal; Notable for the following components:   Glucose-Capillary 106 (*)    All other components within normal limits  RESP PANEL BY RT-PCR (FLU A&B, COVID) ARPGX2  CBC WITH DIFFERENTIAL/PLATELET  PROTIME-INR  HEMOGLOBIN A1C  TROPONIN I (HIGH SENSITIVITY)  TROPONIN I (HIGH SENSITIVITY)    EKG EKG Interpretation  Date/Time:  Monday May 05 2022 07:18:25 EDT Ventricular Rate:  70 PR Interval:  191 QRS Duration: 78 QT Interval:  392 QTC Calculation: 423 R Axis:   68 Text Interpretation: Sinus rhythm Confirmed by Dene Gentry [phone]) on 05/05/2022 7:28:55 AM  Radiology CT ANGIO HEAD NECK W WO CM  Result Date: 05/05/2022 CLINICAL DATA:  88 year old female with neurologic deficit. EXAM: CT ANGIOGRAPHY HEAD AND NECK TECHNIQUE: Multidetector CT imaging of the head and neck was performed using the standard protocol during bolus administration of intravenous contrast. Multiplanar CT image reconstructions and MIPs were obtained to evaluate the vascular anatomy. Carotid stenosis measurements (when applicable) are obtained utilizing NASCET criteria, using the distal internal carotid diameter as the denominator. RADIATION DOSE REDUCTION: This exam was performed according to the departmental dose-optimization program which includes automated exposure control, adjustment of the mA and/or kV according to patient size and/or use of iterative reconstruction technique. CONTRAST:  29m OMNIPAQUE IOHEXOL 350 MG/ML SOLN COMPARISON:  Plain head CT 0825 hours today. Brain MRI and intracranial MRA 02/04/2010. FINDINGS: CTA NECK Skeleton: Absent dentition. Hyperostosis of the calvarium. Cervical spine degeneration although fairly  age-appropriate. No acute osseous abnormality identified. Upper chest: Negative. Other neck: Partially atrophied submandibular glands. No acute finding in the neck. Aortic arch: Calcified aortic atherosclerosis. 3 vessel arch configuration. Right carotid system: Mild brachiocephalic artery plaque without stenosis. Proximal right CCA is normal. Mild calcified plaque before the bifurcation without stenosis. Mild to moderate ICA origin and bulb calcified plaque, but less than 50 % stenosis with respect to the distal vessel. Left carotid system: Similar mild for age calcified plaque through the left ICA origin without stenosis. At the distal bulb there is more moderate calcified plaque, but still less than 50 % stenosis with respect to the distal vessel. Vertebral arteries: Mild plaque at the right subclavian artery origin without stenosis. Bulky calcified plaque adjacent to the right vertebral artery origin with mild  to moderate origin stenosis. But the right vertebral artery remains patent and is otherwise normal to the skull base. Moderate calcified plaque at the left subclavian artery origin but without stenosis. Ulcerated soft plaque along the medial vessel in the mediastinum on series 8, image 158. No stenosis or dissection. Calcified plaque at the left vertebral artery origin with mild to moderate stenosis on series 8, image 155. But codominant and otherwise normal left vertebral to the skull base. CTA HEAD Posterior circulation: Distal vertebral arteries and vertebrobasilar junction are tortuous but patent without stenosis. Both PICA origins are patent. Tortuous basilar artery is patent without stenosis. Patent SCA and left PCA origins. Fetal type right PCA origin. Left posterior communicating artery diminutive or absent. Left PCA branches are diminutive, new since 2011, but patent with no discrete stenosis identified. Larger right PCA branches with mild P2/P3 junction irregularity but no significant stenosis.  Anterior circulation: Both ICA siphons are patent. Mild for age left and mild to moderate right siphon calcified plaque. Only mild bilateral supraclinoid stenosis. Patent carotid termini, patent carotid termini. Normal left MCA and ACA origins. Irregularity at both the right ACA and right MCA origins with mild to moderate stenosis on series 11, image 19. This is new since 2011. Normal anterior communicating artery. Bilateral ACA branches are stable and within normal limits. Left MCA M1 segment and bifurcation are patent without stenosis. Right MCA M1 is patent beyond the origin and to the bifurcation without stenosis. Bilateral MCA branches are stable since 2011 and within normal limits. Venous sinuses: Early contrast timing, grossly patent. Anatomic variants: Fetal right PCA origin. Review of the MIP images confirms the above findings IMPRESSION: 1. Negative for large vessel occlusion. 2. Overall mild for age atherosclerosis in the head and neck. But mild-to-moderate stenosis at the origin of both the Right ACA and MCA has developed since 2011. And similar mild-to-moderate stenosis at both vertebral artery origins. 3.  Aortic Atherosclerosis (ICD10-I70.0). Electronically Signed   By: Genevie Ann M.D.   On: 05/05/2022 09:38   CT Head Wo Contrast  Result Date: 05/05/2022 CLINICAL DATA:  Neuro deficit, acute, stroke suspected EXAM: CT HEAD WITHOUT CONTRAST TECHNIQUE: Contiguous axial images were obtained from the base of the skull through the vertex without intravenous contrast. RADIATION DOSE REDUCTION: This exam was performed according to the departmental dose-optimization program which includes automated exposure control, adjustment of the mA and/or kV according to patient size and/or use of iterative reconstruction technique. COMPARISON:  Head CT 02/04/2010. FINDINGS: Brain: No evidence of acute intracranial hemorrhage or extra-axial collection. No loss of gray-white matter differentiation.No evidence of mass  lesion/concerning mass effect.The ventricles are normal in size.Scattered subcortical and periventricular white matter hypodensities, nonspecific but likely sequela of chronic small vessel ischemic disease. Vascular: Vascular calcifications. Skull: Negative for skull fracture. Hyperostosis frontalis internus. Sinuses/Orbits: No acute finding. Other: None. IMPRESSION: No acute intracranial abnormality. Mild sequela of chronic small vessel ischemic disease. Electronically Signed   By: Maurine Simmering M.D.   On: 05/05/2022 08:32   DG Chest Port 1 View  Result Date: 05/05/2022 CLINICAL DATA:  Weakness and aphasia. EXAM: PORTABLE CHEST 1 VIEW COMPARISON:  03/02/2022 FINDINGS: 0736 hours. The lungs are clear without focal pneumonia, edema, pneumothorax or pleural effusion. The cardiopericardial silhouette is within normal limits for size. The visualized bony structures of the thorax are unremarkable. Telemetry leads overlie the chest. IMPRESSION: No active disease. Electronically Signed   By: Misty Stanley M.D.   On: 05/05/2022 07:43  Procedures Procedures    Medications Ordered in ED Medications   stroke: early stages of recovery book (has no administration in time range)  aspirin suppository 300 mg (has no administration in time range)    Or  aspirin tablet 325 mg (has no administration in time range)  iohexol (OMNIPAQUE) 350 MG/ML injection 100 mL (75 mLs Intravenous Contrast Given 05/05/22 3299)    ED Course/ Medical Decision Making/ A&P                           Medical Decision Making Amount and/or Complexity of Data Reviewed Labs: ordered. Radiology: ordered.  Risk Prescription drug management. Decision regarding hospitalization.    Medical Screen Complete  This patient presented to the ED with complaint of transient aphasia.  This complaint involves an extensive number of treatment options. The initial differential diagnosis includes, but is not limited to, TIA, CVA, metabolic  abnormality, etc. This presentation is: Acute, Self-Limited, Previously Undiagnosed, Uncertain Prognosis, Complicated, Systemic Symptoms, and Threat to Life/Bodily Function  Patient is presenting with transient symptoms including aphasia.  She is at baseline mental status upon arrival to the ED.  Patient will require admission for further work-up of likely TIA event.  Neurology is aware of case and will consult.  Hospitalist service is aware of case and will evaluate for admission.    Additional history obtained: External records from outside sources obtained and reviewed including prior ED visits and prior Inpatient records.    Lab Tests:  I ordered and personally interpreted labs.  The pertinent results include: CBC, CMP, troponin, INR   Imaging Studies ordered:  I ordered imaging studies including CT head, CT angio head and neck I independently visualized and interpreted obtained imaging which showed NAD, no LVO I agree with the radiologist interpretation.   Cardiac Monitoring:  The patient was maintained on a cardiac monitor.  I personally viewed and interpreted the cardiac monitor which showed an underlying rhythm of: NSR Problem List / ED Course:  Transient aphasia   Reevaluation:  After the interventions noted above, I reevaluated the patient and found that they have: Resolved   Disposition:  After consideration of the diagnostic results and the patients response to treatment, I feel that the patent would benefit from admission.  CRITICAL CARE Performed by: Valarie Merino   Total critical care time: 30 minutes  Critical care time was exclusive of separately billable procedures and treating other patients.  Critical care was necessary to treat or prevent imminent or life-threatening deterioration.  Critical care was time spent personally by me on the following activities: development of treatment plan with patient and/or surrogate as well as nursing,  discussions with consultants, evaluation of patient's response to treatment, examination of patient, obtaining history from patient or surrogate, ordering and performing treatments and interventions, ordering and review of laboratory studies, ordering and review of radiographic studies, pulse oximetry and re-evaluation of patient's condition.           Final Clinical Impression(s) / ED Diagnoses Final diagnoses:  TIA (transient ischemic attack)    Rx / DC Orders ED Discharge Orders     None         Valarie Merino, MD 05/05/22 1013

## 2022-05-05 NOTE — Consult Note (Addendum)
Neurology Consultation  Reason for Consult: trouble speaking  Referring Physician: Dr. Francia Greaves   CC: trouble speaking   History is obtained from:patient's daughter at the bedside   HPI: Cheryl Yoder is a 88 y.o. female with past medical history of HTN, HLD, hypothyroidism, gastric cancer and glaucoma who presents to [facility] ED for trouble speaking. Patient lives with daughter. Per daughter, patient called her while she was on her walk stating she was not right and needed to come home soon. When she got back to the house, she found her mom sitting in the chair with very low and slow speech and stated she did not feel right and was weak all over.. Daughter called EMS. When EMS arrived she was not able to hold either of her arms up. Patient states she just did not feel right. She woke up at 0430 in her normal state of health, got a cup of coffee and then developed above symptoms, patient states symptoms lasted about 30 minutes but did not return to her baseline until she arrived at the hospital. She states she remembers all events and could understand everything. Patient states she has never experienced this before. She denies weakness, slurred speech, vision problems or numbness or tingling.  Daughter states she seems back to baseline.   LKW: 0430 IV thrombolysis given?: no, symptoms resolved Premorbid modified Rankin scale (mRS):  0-Completely asymptomatic and back to baseline post-stroke   ROS: Full ROS was performed and is negative except as noted in the HPI.    Past Medical History:  Diagnosis Date   Cancer ([place])    gist   Cataract    Chest pain    GIST (gastrointestinal stromal tumor), malignant ([place])    Glaucoma    Hypertension    Hypothyroid    Kidney disease    Sensorineural hearing loss      Family History  Problem Relation Age of Onset   Diabetes Mother    Rheum arthritis Mother    Arthritis Sister    Diabetes Son    Arthritis Son    Cancer Cousin      Social  History:   reports that she has never smoked. She has never used smokeless tobacco. She reports that she does not drink alcohol and does not use drugs.  Medications No current facility-administered medications for this encounter.  Current Outpatient Medications:    amLODipine (NORVASC) 2.5 MG tablet, Take 1 tablet by mouth daily., Disp: , Rfl:    aspirin 81 MG tablet, Take 81 mg by mouth daily., Disp: , Rfl:    calcium-vitamin D (OSCAL) 250-125 MG-UNIT per tablet, Take 1 tablet by mouth daily.  , Disp: , Rfl:    latanoprost (XALATAN) 0.005 % ophthalmic solution, Place 1 drop into both eyes daily., Disp: , Rfl:    levothyroxine (SYNTHROID, LEVOTHROID) 75 MCG tablet, Take 75 mcg by mouth every morning. , Disp: , Rfl:    losartan (COZAAR) 100 MG tablet, Take 100 mg by mouth at bedtime. , Disp: , Rfl:    Exam: Current vital signs: BP (!) 194/65   Pulse 62   Temp (!) 97.4 F (36.3 C) (Oral)   Resp 15   Ht '5\' 4"'$  (1.626 m)   Wt 52.2 kg   SpO2 99%   BMI 19.74 kg/m  Vital signs in last 24 hours: Temp:  [97.4 F (36.3 C)] 97.4 F (36.3 C) (10/16 0718) Pulse Rate:  [62-71] 62 (10/16 0800) Resp:  [15-26] 15 (10/16 0800)  BP: (194-216)/(65) 194/65 (10/16 0800) SpO2:  [99 %-100 %] 99 % (10/16 0800) Weight:  [52.2 kg] 52.2 kg (10/16 0720)  GENERAL: Awake, alert in NAD HEENT: - Normocephalic and atraumatic, dry mm LUNGS - Clear to auscultation bilaterally with no wheezes CV - S1S2 RRR, no m/r/g, equal pulses bilaterally. ABDOMEN - Soft, nontender, nondistended with normoactive BS Ext: warm, well perfused, intact peripheral pulses, no edema  NEURO:  Mental Status: AA&Ox4 Language: speech is clear.  Naming, repetition, fluency, and comprehension intact. Cranial Nerves: PERRL, EOMI, visual fields full, no facial asymmetry, facial sensation intact, hearing intact, tongue/uvula/soft palate midline, normal sternocleidomastoid and trapezius muscle strength. No evidence of tongue atrophy or  fibrillations Motor: 5/5 in all 4 extremities  Tone: is normal and bulk is normal Sensation- Intact to light touch bilaterally Coordination: FTN intact bilaterally, no ataxia in BLE. Gait- deferred  NIHSS 1a Level of Conscious.: 0 1b LOC Questions: 0 1c LOC Commands: 0 2 Best Gaze: 0 3 Visual: 0 4 Facial Palsy: 0 5a Motor Arm - left: 0 5b Motor Arm - Right: 0 6a Motor Leg - Left: 0 6b Motor Leg - Right: 0 7 Limb Ataxia: 0 8 Sensory: 0 9 Best Language: 0 10 Dysarthria: 0 11 Extinct. and Inatten.: 0 TOTAL: 0   Labs I have reviewed labs in epic and the results pertinent to this consultation are:  CBC    Component Value Date/Time   WBC 5.5 05/05/2022 0725   RBC 4.37 05/05/2022 0725   HGB 12.9 05/05/2022 0800   HGB 12.0 05/31/2013 1339   HCT 38.0 05/05/2022 0800   HCT 35.9 05/31/2013 1339   PLT 272 05/05/2022 0725   PLT 233 05/31/2013 1339   MCV 86.3 05/05/2022 0725   MCV 87.8 05/31/2013 1339   MCH 30.2 05/05/2022 0725   MCHC 35.0 05/05/2022 0725   RDW 12.4 05/05/2022 0725   RDW 13.8 05/31/2013 1339   LYMPHSABS 1.2 05/05/2022 0725   LYMPHSABS 1.9 05/31/2013 1339   MONOABS 0.3 05/05/2022 0725   MONOABS 0.3 05/31/2013 1339   EOSABS 0.0 05/05/2022 0725   EOSABS 0.1 05/31/2013 1339   BASOSABS 0.0 05/05/2022 0725   BASOSABS 0.0 05/31/2013 1339    CMP     Component Value Date/Time   NA 133 (L) 05/05/2022 0800   NA 140 05/31/2013 1339   K 4.4 05/05/2022 0800   K 3.8 05/31/2013 1339   CL 98 05/05/2022 0800   CL 107 06/01/2012 1525   CO2 24 05/05/2022 0725   CO2 26 05/31/2013 1339   GLUCOSE 102 (H) 05/05/2022 0800   GLUCOSE 113 05/31/2013 1339   GLUCOSE 110 (H) 06/01/2012 1525   BUN 19 05/05/2022 0800   BUN 12.3 05/31/2013 1339   CREATININE 0.80 05/05/2022 0800   CREATININE 0.8 05/31/2013 1339   CALCIUM 9.8 05/05/2022 0725   CALCIUM 9.8 05/31/2013 1339   PROT 6.4 (L) 05/05/2022 0725   PROT 6.9 05/31/2013 1339   ALBUMIN 3.5 05/05/2022 0725   ALBUMIN 3.6  05/31/2013 1339   AST 22 05/05/2022 0725   AST 18 05/31/2013 1339   ALT 13 05/05/2022 0725   ALT 8 05/31/2013 1339   ALKPHOS 55 05/05/2022 0725   ALKPHOS 74 05/31/2013 1339   BILITOT 0.6 05/05/2022 0725   BILITOT 0.34 05/31/2013 1339   GFRNONAA >60 05/05/2022 0725   GFRAA 87 (L) 12/04/2012 0452    Lipid Panel  No results found for: "CHOL", "TRIG", "HDL", "CHOLHDL", "VLDL", "LDLCALC", "LDLDIRECT"   Imaging  I have reviewed the images obtained:  CT-head No acute intracranial abnormality. Mild sequela of chronic small vessel ischemic disease  CTA head/neck  1. Negative for large vessel occlusion. 2. Overall mild for age atherosclerosis in the head and neck. But mild-to-moderate stenosis at the origin of both the Right ACA and MCA has developed since 2011. And similar mild-to-moderate stenosis at both vertebral artery origins. 3.  Aortic Atherosclerosis  Assessment:  Cheryl Yoder is a 88 y.o. female with past medical history of HTN, HLD, hypothyroidism, gastric cancer and glaucoma who presents to [facility] ED for trouble speaking.   TIA  Recommendations: - HgbA1c, fasting lipid panel - MRI of the brain without contrast - Frequent neuro checks - Echocardiogram - Prophylactic therapy-Antiplatelet med: Aspirin - dose '81mg'$   - Risk factor modification - Telemetry monitoring - PT consult, OT consult, Speech consult - Stroke team to follow    Beulah Gandy DNP, ACNPC-AG   I have seen the patient reviewed the above note.  My suspicion is that this likely does represent transient ischemic attack based on the description of the event.  Given that there was no focal weakness, I do think an EEG would be reasonable as well, but my suspicion for seizure is considerably lower than a cerebrovascular etiology.  Treatment plan as above, stroke team to follow.  Roland Rack, MD [facility] [phone]  If 7pm- 7am, please page neurology on call as listed in [place].

## 2022-05-05 NOTE — H&P (Signed)
History and Physical    Patient: Cheryl Yoder HKV:[id number] DOB: [birth year] DOA: 05/05/2022 DOS: the patient was seen and examined on 05/05/2022 PCP: Michael Boston, MD  Patient coming from: Home - lives with daughter; Donald Prose: Daughter, [phone]   Chief Complaint: Aphasia  HPI: Cheryl Yoder is a 88 y.o. female with medical history significant of GIST;  glaucoma; HTN; hypothyroidism; and CKD presenting with aphasia.  She reports that she had a spell that came on.  She didn't feel right.  She felt like she needed to call her daughter.  She has had these speels before.  It affects her head, it doesn't feel right.  She can't describe it and becomes lifeless, not as alert, SOB.  The last spell before today was maybe 6 months ago according to her daughter but the patient reports she has had a couple of them more recently and she didn't mention it.  They are usually momentarily but today's episode lasted longer, maybe 20+ minutes.  By the time she was in the ER she was much more alert, awake.  She awoke about 0430 and felt normal.  Symptoms started and she got up to get coffee but still didn't feel good.  She went to the chair and felt worse.  She was awake but both arms were flaccid, she could talk with her eyes closed.  She was able to call her daughter and could report not feeling right.  Arms and legs feel weak symmetric.  Some word finding difficulty, wasn't "as fluid", more monosyllabic than usual.  Symptoms resolved completely and she feels normal now.  No prior evaluation for these episodes.  No h/o seizures.  She was last seen by Dr. Angelena Form on 9/25.  She has reported difficulty tolerating BP medications - has not tolerated HCTZ, Coreg, hydralazine due to hypotension, fatigue.  She is still on Cozaar qhs and she was taking amlodipine QOD but this is on hold due to fatigue.  She is currently taking hydralazine 12.5 mg BID in addition to losartan.    ER Course:  Normal self this AM and  then 30 minutes of confusion, aphasia.  Symptoms resolved.  No prior h/o CVA.  Head CT negative.  Neuro to consult, needs TIA evaluation.  CTA head/neck negative.     Review of Systems: As mentioned in the history of present illness. All other systems reviewed and are negative. Past Medical History:  Diagnosis Date   Cataract    Chest pain    GIST (gastrointestinal stromal tumor), malignant ([place]) 2008   Glaucoma    Hypertension    Hypothyroid    Kidney disease    Sensorineural hearing loss    Past Surgical History:  Procedure Laterality Date   ABDOMINAL HYSTERECTOMY     ESOPHAGOGASTRODUODENOSCOPY  08/14/2011   Procedure: ESOPHAGOGASTRODUODENOSCOPY (EGD);  Surgeon: Owens Loffler, MD;  Location: [facility];  Service: Endoscopy;  Laterality: N/A;   EUS  08/14/2011   Procedure: UPPER ENDOSCOPIC ULTRASOUND (EUS) LINEAR;  Surgeon: Owens Loffler, MD;  Location: [facility] END[facility];  Service: Endoscopy;  Laterality: N/A;   STOMACH SURGERY  2006   gist tumor removed   TOTAL HIP ARTHROPLASTY Right 12/02/2012   Procedure: RIGHT TOTAL HIP ARTHROPLASTY;  Surgeon: Tobi Bastos, MD;  Location: [facility]S;  Service: Orthopedics;  Laterality: Right;   Social History:  reports that she has never smoked. She has never used smokeless tobacco. She reports that she does not drink alcohol and does not use drugs.  Allergies  Allergen Reactions   Ciprocinonide [Fluocinolone] Nausea And Vomiting    Causes N/V and diarrhea   Codeine Sulfate Nausea And Vomiting   Penicillins Other (See Comments)    unknown   Sulfonamide Derivatives Rash    Family History  Problem Relation Age of Onset   Diabetes Mother    Rheum arthritis Mother    Arthritis Sister    Diabetes Son    Arthritis Son    Cancer Cousin    Stroke Neg Hx    Seizures Neg Hx     Prior to Admission medications   Medication Sig Start Date End Date Taking? Authorizing Provider  aspirin 81 MG tablet Take 81 mg by mouth daily.   Yes [provider]  calcium-vitamin D (OSCAL) 250-125 MG-UNIT per tablet Take 1 tablet by mouth daily.     Yes [provider]  hydrALAZINE (APRESOLINE) 25 MG tablet Take 25 mg by mouth daily.   Yes [provider]  levothyroxine (SYNTHROID, LEVOTHROID) 75 MCG tablet Take 75 mcg by mouth every morning.    Yes [provider]  losartan (COZAAR) 100 MG tablet Take 100 mg by mouth at bedtime.    Yes [provider]  amLODipine (NORVASC) 2.5 MG tablet Take 1 tablet by mouth daily. 03/06/22   [provider]  latanoprost (XALATAN) 0.005 % ophthalmic solution Place 1 drop into both eyes daily. 01/19/22   [provider]    Physical Exam: Vitals:   05/05/22 0718 05/05/22 0720 05/05/22 0800  BP: (!) 216/65  (!) 194/65  Pulse: 71  62  Resp: (!) 26  15  Temp: (!) 97.4 F (36.3 C)    TempSrc: Oral    SpO2: 100%  99%  Weight:  52.2 kg   Height:  '5\' 4"'$  (1.626 m)    General:  Appears calm and comfortable and is in NAD, frail Eyes:  PERRL, EOMI, normal lids, iris ENT:  hard of hearing which is improved with hearing aids in place, grossly normal lips & tongue, mmm; artificial dentition Neck:  no LAD, masses or thyromegaly Cardiovascular:  RRR, no m/r/g. No LE edema.  Respiratory:   CTA bilaterally with no wheezes/rales/rhonchi.  Normal respiratory effort. Abdomen:  soft, NT, ND Skin:  no rash or induration seen on limited exam Musculoskeletal:  grossly normal tone BUE/BLE, good ROM, no bony abnormality Psychiatric:  blunted mood and affect, speech fluent and appropriate, AOx3 Neurologic:  CN 2-12 grossly intact, moves all extremities in coordinated fashion, sensation intact   Radiological Exams on Admission: Independently reviewed - see discussion in A/P where applicable  CT ANGIO HEAD NECK W WO CM  Result Date: 05/05/2022 CLINICAL DATA:  88 year old female with neurologic deficit. EXAM: CT ANGIOGRAPHY HEAD AND NECK TECHNIQUE: Multidetector CT  imaging of the head and neck was performed using the standard protocol during bolus administration of intravenous contrast. Multiplanar CT image reconstructions and MIPs were obtained to evaluate the vascular anatomy. Carotid stenosis measurements (when applicable) are obtained utilizing NASCET criteria, using the distal internal carotid diameter as the denominator. RADIATION DOSE REDUCTION: This exam was performed according to the departmental dose-optimization program which includes automated exposure control, adjustment of the mA and/or kV according to patient size and/or use of iterative reconstruction technique. CONTRAST:  66m OMNIPAQUE IOHEXOL 350 MG/ML SOLN COMPARISON:  Plain head CT 0825 hours today. Brain MRI and intracranial MRA 02/04/2010. FINDINGS: CTA NECK Skeleton: Absent dentition. Hyperostosis of the calvarium. Cervical spine degeneration although fairly  age-appropriate. No acute osseous abnormality identified. Upper chest: Negative. Other neck: Partially atrophied submandibular glands. No acute finding in the neck. Aortic arch: Calcified aortic atherosclerosis. 3 vessel arch configuration. Right carotid system: Mild brachiocephalic artery plaque without stenosis. Proximal right CCA is normal. Mild calcified plaque before the bifurcation without stenosis. Mild to moderate ICA origin and bulb calcified plaque, but less than 50 % stenosis with respect to the distal vessel. Left carotid system: Similar mild for age calcified plaque through the left ICA origin without stenosis. At the distal bulb there is more moderate calcified plaque, but still less than 50 % stenosis with respect to the distal vessel. Vertebral arteries: Mild plaque at the right subclavian artery origin without stenosis. Bulky calcified plaque adjacent to the right vertebral artery origin with mild to moderate origin stenosis. But the right vertebral artery remains patent and is otherwise normal to the skull base. Moderate calcified  plaque at the left subclavian artery origin but without stenosis. Ulcerated soft plaque along the medial vessel in the mediastinum on series 8, image 158. No stenosis or dissection. Calcified plaque at the left vertebral artery origin with mild to moderate stenosis on series 8, image 155. But codominant and otherwise normal left vertebral to the skull base. CTA HEAD Posterior circulation: Distal vertebral arteries and vertebrobasilar junction are tortuous but patent without stenosis. Both PICA origins are patent. Tortuous basilar artery is patent without stenosis. Patent SCA and left PCA origins. Fetal type right PCA origin. Left posterior communicating artery diminutive or absent. Left PCA branches are diminutive, new since 2011, but patent with no discrete stenosis identified. Larger right PCA branches with mild P2/P3 junction irregularity but no significant stenosis. Anterior circulation: Both ICA siphons are patent. Mild for age left and mild to moderate right siphon calcified plaque. Only mild bilateral supraclinoid stenosis. Patent carotid termini, patent carotid termini. Normal left MCA and ACA origins. Irregularity at both the right ACA and right MCA origins with mild to moderate stenosis on series 11, image 19. This is new since 2011. Normal anterior communicating artery. Bilateral ACA branches are stable and within normal limits. Left MCA M1 segment and bifurcation are patent without stenosis. Right MCA M1 is patent beyond the origin and to the bifurcation without stenosis. Bilateral MCA branches are stable since 2011 and within normal limits. Venous sinuses: Early contrast timing, grossly patent. Anatomic variants: Fetal right PCA origin. Review of the MIP images confirms the above findings IMPRESSION: 1. Negative for large vessel occlusion. 2. Overall mild for age atherosclerosis in the head and neck. But mild-to-moderate stenosis at the origin of both the Right ACA and MCA has developed since 2011. And  similar mild-to-moderate stenosis at both vertebral artery origins. 3.  Aortic Atherosclerosis (ICD10-I70.0). Electronically Signed   By: Genevie Ann M.D.   On: 05/05/2022 09:38   CT Head Wo Contrast  Result Date: 05/05/2022 CLINICAL DATA:  Neuro deficit, acute, stroke suspected EXAM: CT HEAD WITHOUT CONTRAST TECHNIQUE: Contiguous axial images were obtained from the base of the skull through the vertex without intravenous contrast. RADIATION DOSE REDUCTION: This exam was performed according to the departmental dose-optimization program which includes automated exposure control, adjustment of the mA and/or kV according to patient size and/or use of iterative reconstruction technique. COMPARISON:  Head CT 02/04/2010. FINDINGS: Brain: No evidence of acute intracranial hemorrhage or extra-axial collection. No loss of gray-white matter differentiation.No evidence of mass lesion/concerning mass effect.The ventricles are normal in size.Scattered subcortical and periventricular white matter hypodensities, nonspecific  but likely sequela of chronic small vessel ischemic disease. Vascular: Vascular calcifications. Skull: Negative for skull fracture. Hyperostosis frontalis internus. Sinuses/Orbits: No acute finding. Other: None. IMPRESSION: No acute intracranial abnormality. Mild sequela of chronic small vessel ischemic disease. Electronically Signed   By: Maurine Simmering M.D.   On: 05/05/2022 08:32   DG Chest Port 1 View  Result Date: 05/05/2022 CLINICAL DATA:  Weakness and aphasia. EXAM: PORTABLE CHEST 1 VIEW COMPARISON:  03/02/2022 FINDINGS: 0736 hours. The lungs are clear without focal pneumonia, edema, pneumothorax or pleural effusion. The cardiopericardial silhouette is within normal limits for size. The visualized bony structures of the thorax are unremarkable. Telemetry leads overlie the chest. IMPRESSION: No active disease. Electronically Signed   By: Misty Stanley M.D.   On: 05/05/2022 07:43    EKG: Independently  reviewed.  NSR with rate 70; no evidence of acute ischemia   Labs on Admission: I have personally reviewed the available labs and imaging studies at the time of the admission.  Pertinent labs:    Na++ 133 Glucose 107 HS troponin 5 Normal CBC   Assessment and Plan: Principal Problem:   TIA (transient ischemic attack) Active Problems:   Uncontrolled hypertension   Hypothyroidism   Glaucoma (increased eye pressure)   DNR (do not resuscitate)    Possible TIA -Spell was initially described to me as a single episode of aphasia that resolved completely - very suggestive of TIA -However, on discussion with patient and her daughter, she periodically has these episodes of generalized weakness but today's episode lasted longer than usual -Still somewhat concerning for TIA but less so with the current description -Aspirin has been given to reduce stroke mortality and decrease morbidity -Will place in observation status for CVA/TIA evaluation -Telemetry monitoring -CT and CTA not overly concerning -MRI -Echo -Neurology consult -PT/OT/ST/Nutrition Consults -Check FLP; statin therapy tends to be less beneficial in this demographic so will hold for now  Uncontrolled HTN -She has historically had difficulty tolerating BP medications -She could not tolerate HCTZ or spironolactone due to K+ issues -Carvedilol caused her to be excessively fatigued -Amlodipine also caused fatigue, although she was taking it every other day and is willing to try this again if needed since she was also having thyroid dysfunction at the same time -She appears to be able to tolerate losartan but is currently on max dosing -She is also tolerating hydralazine at 12.5 mg BID but unable to tolerate a higher dose; would consider changing to 10 mg TID -Allow permissive HTN for now -Treat BP only if >220/120, and then with goal of 15% reduction -Possibly catapres trial to see if transdermal dosing helps? -Regardless,  given her age her BP goal is <160 and tight BP control is likely a greater risk for her (she gets dizzy and has an increased risk of falling)   Hypothyroidism -Her daughter reports that her TSH dose was recently increased; she thinks fatigue associated with hypothyroidism may have been contributing to her symptoms that were attributed to amlodipine -Continue Synthroid at current dosing for now  Glaucoma -Continue latanoprost   DNR -I have discussed code status with the patient and her daughter and  they are in agreement that the patient would not desire resuscitation and would prefer to die a natural death should that situation arise. -She will need a gold out of facility DNR form at the time of discharge       Advance Care Planning:   Code Status: DNR   Consults: Neurology;  PT/OT/ST/Nutrition; TOC team  DVT Prophylaxis: Lovenox  Family Communication: Daughter was present throughout evaluation  Severity of Illness: The appropriate patient status for this patient is OBSERVATION. Observation status is judged to be reasonable and necessary in order to provide the required intensity of service to ensure the patient's safety. The patient's presenting symptoms, physical exam findings, and initial radiographic and laboratory data in the context of their medical condition is felt to place them at decreased risk for further clinical deterioration. Furthermore, it is anticipated that the patient will be medically stable for discharge from the hospital within 2 midnights of admission.   Author: Karmen Bongo, MD 05/05/2022 10:46 AM  For on call review [email or web address].

## 2022-05-05 NOTE — ED Notes (Signed)
Pt called out with dizziness and tachypnea with RR 35, oxygen 99% HR 70, BP 106/51  Pt reports her head feels like it is spinning around and feels like she can't get air.   Pt breathing slower and feeling better, but a little shaky.

## 2022-05-05 NOTE — Evaluation (Signed)
Physical Therapy Evaluation Patient Details Name: Cheryl Yoder MRN: [id number] DOB: [birth year] Today's Date: 05/05/2022  History of Present Illness  Pt is a 88 y/o female admitted secondary to speech difficulty and weakness. Likely secondary to TIA. MRI negative. PMH includes HTN and glaucoma.  Clinical Impression  Pt admitted secondary to problem above with deficits below. Pt requiring min guard to min A for mobility tasks. Mild weakness and unsteadiness, but no overt LOB. Educated about walking program to perform at home. Pt reports she does not feel she will need AD. Anticipate pt will progress well and will not require follow up PT. Will continue to follow acutely.      Recommendations for follow up therapy are one component of a multi-disciplinary discharge planning process, led by the attending physician.  Recommendations may be updated based on patient status, additional functional criteria and insurance authorization.  Follow Up Recommendations No PT follow up      Assistance Recommended at Discharge Intermittent Supervision/Assistance  Patient can return home with the following  A little help with bathing/dressing/bathroom;Help with stairs or ramp for entrance;Assist for transportation;Assistance with cooking/housework    Equipment Recommendations None recommended by PT  Recommendations for Other Services       Functional Status Assessment Patient has had a recent decline in their functional status and demonstrates the ability to make significant improvements in function in a reasonable and predictable amount of time.     Precautions / Restrictions Precautions Precautions: Fall Restrictions Weight Bearing Restrictions: No      Mobility  Bed Mobility Overal bed mobility: Needs Assistance Bed Mobility: Supine to Sit, Sit to Supine     Supine to sit: Min assist Sit to supine: Supervision   General bed mobility comments: Min A For trunk elevation     Transfers Overall transfer level: Needs assistance Equipment used: 1 person hand held assist Transfers: Sit to/from Stand Sit to Stand: Min guard           General transfer comment: Min guard for safety    Ambulation/Gait Ambulation/Gait assistance: Min guard Gait Distance (Feet): 100 Feet Assistive device: 1 person hand held assist Gait Pattern/deviations: Step-through pattern Gait velocity: Decreased     General Gait Details: Slow, cautious gait. Mild unsteadiness noted, but no overt LOB. Educated about walking program for home  Stairs            Wheelchair Mobility    Modified Rankin (Stroke Patients Only)       Balance Overall balance assessment: Mild deficits observed, not formally tested                                           Pertinent Vitals/Pain Pain Assessment Pain Assessment: No/denies pain    Home Living Family/patient expects to be discharged to:: Private residence Living Arrangements: Children Available Help at Discharge: Family;Available PRN/intermittently Type of Home: House Home Access: Ramped entrance       Home Layout: One level Home Equipment: None      Prior Function Prior Level of Function : Independent/Modified Independent                     Hand Dominance        Extremity/Trunk Assessment   Upper Extremity Assessment Upper Extremity Assessment: Generalized weakness    Lower Extremity Assessment Lower Extremity Assessment: Generalized weakness  Cervical / Trunk Assessment Cervical / Trunk Assessment: Normal  Communication   Communication: No difficulties  Cognition Arousal/Alertness: Awake/alert Behavior During Therapy: WFL for tasks assessed/performed Overall Cognitive Status: Within Functional Limits for tasks assessed                                          General Comments General comments (skin integrity, edema, etc.): Pt's daughter present during  session    Exercises     Assessment/Plan    PT Assessment Patient needs continued PT services  PT Problem List Decreased strength;Decreased activity tolerance;Decreased balance;Decreased mobility       PT Treatment Interventions Gait training;Stair training;Functional mobility training;Therapeutic activities;Therapeutic exercise;Balance training;Patient/family education    PT Goals (Current goals can be found in the Care Plan section)  Acute Rehab PT Goals Patient Stated Goal: to go home PT Goal Formulation: With patient Time For Goal Achievement: 05/19/22 Potential to Achieve Goals: Good    Frequency Min 3X/week     Co-evaluation               AM-PAC PT "6 Clicks" Mobility  Outcome Measure Help needed turning from your back to your side while in a flat bed without using bedrails?: A Little Help needed moving from lying on your back to sitting on the side of a flat bed without using bedrails?: A Little Help needed moving to and from a bed to a chair (including a wheelchair)?: A Little Help needed standing up from a chair using your arms (e.g., wheelchair or bedside chair)?: A Little Help needed to walk in hospital room?: A Little Help needed climbing 3-5 steps with a railing? : A Little 6 Click Score: 18    End of Session Equipment Utilized During Treatment: Gait belt Activity Tolerance: Patient tolerated treatment well Patient left: in bed;with call bell/phone within reach (on stretcher in ED) Nurse Communication: Mobility status PT Visit Diagnosis: Unsteadiness on feet (R26.81);Muscle weakness (generalized) (M62.81)    Time: 7342-8768 PT Time Calculation (min) (ACUTE ONLY): 12 min   Charges:   PT Evaluation $PT Eval Low Complexity: 1 Low          Reuel Derby, PT, DPT  Acute Rehabilitation Services  Office: [phone]   Rudean Hitt 05/05/2022, 7:35 PM

## 2022-05-05 NOTE — Progress Notes (Signed)
EEG complete - results pending 

## 2022-05-06 ENCOUNTER — Observation Stay (HOSPITAL_BASED_OUTPATIENT_CLINIC_OR_DEPARTMENT_OTHER): Payer: PPO

## 2022-05-06 ENCOUNTER — Other Ambulatory Visit: Payer: Self-pay

## 2022-05-06 DIAGNOSIS — R4182 Altered mental status, unspecified: Secondary | ICD-10-CM

## 2022-05-06 DIAGNOSIS — R55 Syncope and collapse: Secondary | ICD-10-CM | POA: Diagnosis not present

## 2022-05-06 DIAGNOSIS — G459 Transient cerebral ischemic attack, unspecified: Secondary | ICD-10-CM | POA: Diagnosis not present

## 2022-05-06 DIAGNOSIS — I10 Essential (primary) hypertension: Secondary | ICD-10-CM

## 2022-05-06 LAB — LIPID PANEL
Cholesterol: 139 mg/dL (ref 0–200)
HDL: 46 mg/dL (ref >40)
LDL Cholesterol: 83 mg/dL (ref 0–99)
Total CHOL/HDL Ratio: 3 RATIO
Triglycerides: 52 mg/dL (ref <150)
VLDL: 10 mg/dL (ref 0–40)

## 2022-05-06 LAB — BASIC METABOLIC PANEL
Anion gap: 7 (ref 5–15)
BUN: 17 mg/dL (ref 8–23)
CO2: 23 mmol/L (ref 22–32)
Calcium: 8.7 mg/dL — ABNORMAL LOW (ref 8.9–10.3)
Chloride: 104 mmol/L (ref 98–111)
Creatinine, Ser: 0.86 mg/dL (ref 0.44–1.00)
GFR, Estimated: >60 mL/min (ref >60)
Glucose, Bld: 96 mg/dL (ref 70–99)
Potassium: 3.7 mmol/L (ref 3.5–5.1)
Sodium: 134 mmol/L — ABNORMAL LOW (ref 135–145)

## 2022-05-06 LAB — CBC
HCT: 32.7 % — ABNORMAL LOW (ref 36.0–46.0)
Hemoglobin: 11.5 g/dL — ABNORMAL LOW (ref 12.0–15.0)
MCH: 30.4 pg (ref 26.0–34.0)
MCHC: 35.2 g/dL (ref 30.0–36.0)
MCV: 86.5 fL (ref 80.0–100.0)
Platelets: 260 10*3/uL (ref 150–400)
RBC: 3.78 MIL/uL — ABNORMAL LOW (ref 3.87–5.11)
RDW: 12.7 % (ref 11.5–15.5)
WBC: 5.4 10*3/uL (ref 4.0–10.5)
nRBC: 0 % (ref 0.0–0.2)

## 2022-05-06 LAB — TSH: TSH: 2.701 u[IU]/mL (ref 0.350–4.500)

## 2022-05-06 LAB — CORTISOL: Cortisol, Plasma: 7.6 ug/dL

## 2022-05-06 LAB — ECHOCARDIOGRAM COMPLETE
Area-P 1/2: 2.95 cm2
Height: 64 in
S' Lateral: 2.4 cm
Weight: 1840 oz

## 2022-05-06 LAB — T4, FREE: Free T4: 0.83 ng/dL (ref 0.61–1.12)

## 2022-05-06 MED ORDER — PRAVASTATIN SODIUM 40 MG PO TABS
20.0000 mg | ORAL_TABLET | Freq: Every day | ORAL | Status: DC
  Administered 2022-05-06: 20 mg via ORAL
  Filled 2022-05-06: qty 1

## 2022-05-06 MED ORDER — CLOPIDOGREL BISULFATE 75 MG PO TABS
75.0000 mg | ORAL_TABLET | Freq: Every day | ORAL | Status: DC
  Administered 2022-05-06: 75 mg via ORAL
  Filled 2022-05-06: qty 1

## 2022-05-06 MED ORDER — HYDRALAZINE HCL 25 MG PO TABS
25.0000 mg | ORAL_TABLET | Freq: Every day | ORAL | Status: DC
  Administered 2022-05-06: 25 mg via ORAL
  Filled 2022-05-06: qty 1

## 2022-05-06 MED ORDER — PRAVASTATIN SODIUM 20 MG PO TABS: 20.0000 mg | ORAL_TABLET | Freq: Every day | ORAL | 2 refills | Status: AC

## 2022-05-06 MED ORDER — PERFLUTREN LIPID MICROSPHERE
1.0000 mL | INTRAVENOUS | Status: AC | PRN
  Administered 2022-05-06: 6 mL via INTRAVENOUS
  Filled 2022-05-06: qty 10

## 2022-05-06 MED ORDER — ASPIRIN 81 MG PO TABS: 81.0000 mg | ORAL_TABLET | Freq: Every day | ORAL | 0 refills | Status: AC

## 2022-05-06 MED ORDER — LOSARTAN POTASSIUM 50 MG PO TABS: 100.0000 mg | ORAL_TABLET | Freq: Every day | ORAL | Status: DC

## 2022-05-06 MED ORDER — CLOPIDOGREL BISULFATE 75 MG PO TABS: 75.0000 mg | ORAL_TABLET | Freq: Every day | ORAL | 2 refills | Status: DC

## 2022-05-06 MED ORDER — COSYNTROPIN 0.25 MG IJ SOLR
0.2500 mg | Freq: Once | INTRAMUSCULAR | Status: DC
  Filled 2022-05-06: qty 0.25

## 2022-05-06 NOTE — Evaluation (Signed)
Occupational Therapy Evaluation/Discharge Patient Details Name: Cheryl Yoder MRN: [id number] DOB: [birth year] Today's Date: 05/06/2022   History of Present Illness Pt is a 88 y/o female admitted secondary to speech difficulty and weakness. Likely secondary to TIA. MRI negative. PMH includes HTN and glaucoma.   Clinical Impression   PTA, pt lives with daughter and typically Independent with ADLs/mobility without AD. Pt previously walking 1 mile daily up until progressive weakness a few weeks ago. Pt presents now with minor deficits in strength and dynamic standing balance though anticipate quick improvements with continued mobility during hospitalization. Pt requires no more than min guard for LB ADLs and hallway mobility without AD, able to self correct 2 minor LOB. Assessed orthostatic vitals with BP fluctuations noted though  pt did endorse feeling a little "woozy" when walking. No further skilled OT services needed. If pt moved to floor, would benefit from mobility specialist service.   BP sitting: 92/44 BP with initial standing: 78/62 BP standing > 2 min: 104/48 BP after mobility: 106/40      Recommendations for follow up therapy are one component of a multi-disciplinary discharge planning process, led by the attending physician.  Recommendations may be updated based on patient status, additional functional criteria and insurance authorization.   Follow Up Recommendations  No OT follow up    Assistance Recommended at Discharge PRN  Patient can return home with the following      Functional Status Assessment  Patient has had a recent decline in their functional status and demonstrates the ability to make significant improvements in function in a reasonable and predictable amount of time.  Equipment Recommendations  None recommended by OT    Recommendations for Other Services       Precautions / Restrictions Precautions Precautions: Fall Restrictions Weight Bearing  Restrictions: No      Mobility Bed Mobility               General bed mobility comments: up in chair on entry    Transfers Overall transfer level: Needs assistance Equipment used: None Transfers: Sit to/from Stand Sit to Stand: Supervision                  Balance Overall balance assessment: Mild deficits observed, not formally tested                                         ADL either performed or assessed with clinical judgement   ADL Overall ADL's : Needs assistance/impaired Eating/Feeding: Independent   Grooming: Set up;Standing   Upper Body Bathing: Independent   Lower Body Bathing: Supervison/ safety   Upper Body Dressing : Independent   Lower Body Dressing: Set up Lower Body Dressing Details (indicate cue type and reason): able to easily doff/don socks Toilet Transfer: Min guard;Ambulation   Toileting- Clothing Manipulation and Hygiene: Supervision/safety;Sit to/from stand;Sitting/lateral lean       Functional mobility during ADLs: Min guard General ADL Comments: minor unsteadiness with progressive activity, able to correct minor LOB and pt able to self monitor symptoms well. Encouraged pt/daughter to have supervision for showering tasks initially     Vision Baseline Vision/History: 0 No visual deficits Ability to See in Adequate Light: 0 Adequate Patient Visual Report: No change from baseline Vision Assessment?: No apparent visual deficits     Perception     Praxis      Pertinent Vitals/Pain Pain  Assessment Pain Assessment: No/denies pain     Hand Dominance Right   Extremity/Trunk Assessment Upper Extremity Assessment Upper Extremity Assessment: Generalized weakness   Lower Extremity Assessment Lower Extremity Assessment: Defer to PT evaluation   Cervical / Trunk Assessment Cervical / Trunk Assessment: Normal   Communication Communication Communication: No difficulties   Cognition Arousal/Alertness:  Awake/alert Behavior During Therapy: WFL for tasks assessed/performed Overall Cognitive Status: Within Functional Limits for tasks assessed                                       General Comments  Pt's daughter present and supportive. Per daughter, pt BP usually runs 413K/44-01U and systolic BP much lower than usually though not positive for orthostatics    Exercises     Shoulder Instructions      Home Living Family/patient expects to be discharged to:: Private residence Living Arrangements: Children Available Help at Discharge: Family;Available PRN/intermittently Type of Home: House Home Access: Ramped entrance     Home Layout: One level     Bathroom Shower/Tub: Tub/shower unit;Walk-in shower   Bathroom Toilet: Handicapped height     Home Equipment: None          Prior Functioning/Environment Prior Level of Function : Independent/Modified Independent             Mobility Comments: previously walking 1 mile around the house a few weeks ago before progressive weakness          OT Problem List: Impaired balance (sitting and/or standing);Decreased activity tolerance      OT Treatment/Interventions:      OT Goals(Current goals can be found in the care plan section) Acute Rehab OT Goals Patient Stated Goal: return to normal activities OT Goal Formulation: All assessment and education complete, DC therapy  OT Frequency:      Co-evaluation              AM-PAC OT "6 Clicks" Daily Activity     Outcome Measure Help from another person eating meals?: None Help from another person taking care of personal grooming?: A Little Help from another person toileting, which includes using toliet, bedpan, or urinal?: A Little Help from another person bathing (including washing, rinsing, drying)?: A Little Help from another person to put on and taking off regular upper body clothing?: None Help from another person to put on and taking off regular lower  body clothing?: A Little 6 Click Score: 20   End of Session Equipment Utilized During Treatment: Gait belt  Activity Tolerance: Patient tolerated treatment well Patient left: in chair;with family/visitor present  OT Visit Diagnosis: Unsteadiness on feet (R26.81)                Time: 2725-3664 OT Time Calculation (min): 16 min Charges:  OT General Charges $OT Visit: 1 Visit OT Evaluation $OT Eval Low Complexity: 1 Low  Malachy Chamber, OTR/L Acute Rehab Services Office: [phone]   Layla Maw 05/06/2022, 8:01 AM

## 2022-05-06 NOTE — Progress Notes (Signed)
SLP Cancellation Note  Patient Details Name: Cheryl Yoder MRN: [id number] DOB: 1928-07-02   Cancelled treatment:       Reason Eval/Treat Not Completed: SLP screened, no needs identified, will sign off. Pt politely declines testing as she feels as though all of her symptoms have resolved. In reviewing her chart, MRI was negative, and MD note indicates that the daughter, with whom the pt lives, also believed that the pt had returned to her baseline. Will defer SLP eval per pt preference but encouraged her to use some extra support/supervision from her daughter upon initial return home as a precaution.     Osie Bond., M.A. [place] Office [phone]  Secure chat preferred  05/06/2022, 11:43 AM

## 2022-05-06 NOTE — Procedures (Signed)
Patient Name: Cheryl Yoder  MRN: [id number]  Epilepsy Attending: Lora Havens  Referring Physician/Provider: Greta Doom, MD  Date: 05/05/2022  Duration: 21.19 mins  Patient history: 88 y.o. female with past medical history of HTN, HLD, hypothyroidism, gastric cancer and glaucoma who presents to [facility] ED for trouble speaking. EEG to evaluate for seizure   Level of alertness: Awake  AEDs during EEG study: None  Technical aspects: This EEG study was done with scalp electrodes positioned according to the 10-20 International system of electrode placement. Electrical activity was reviewed with band pass filter of 1-'70Hz'$ , sensitivity of 7 uV/mm, display speed of 45m/sec with a '60Hz'$  notched filter applied as appropriate. EEG data were recorded continuously and digitally stored.  Video monitoring was available and reviewed as appropriate.  Description: No clear posterior dominant rhythm was seen.  EEG showed continuous generalized 3 to 5 Hz theta-theta slowing.  Hyperventilation and photic stimulation were not performed.     Of note, study was technically difficult due to significant myogenic artifact  ABNORMALITY - Continuous slow, generalized  IMPRESSION: This technically difficult study is suggestive of moderate diffuse encephalopathy, nonspecific etiology.  No seizures or epileptiform discharges were seen throughout the recording.  Please consider repeat EEG if concern for ictal-interictal activity persists.   Jalyiah Shelley OBarbra Sarks

## 2022-05-06 NOTE — Discharge Summary (Signed)
[facility]  Physician Discharge Summary   Patient ID: Cheryl Yoder MRN: [id number] DOB/AGE: 12/06/1927 88 y.o.  Admit date: 05/05/2022 Discharge date:   05/06/2022   PCP: Michael Boston, MD  DISCHARGE DIAGNOSES:    TIA (transient ischemic attack)   Uncontrolled hypertension   Hypothyroidism   Glaucoma (increased eye pressure)   DNR (do not resuscitate)   RECOMMENDATIONS FOR OUTPATIENT FOLLOW UP: Patient's cortisol level was low normal.  However she is not exhibiting any signs or symptoms of adrenal insufficiency.  This can be pursued further in the outpatient setting as indicated Ambulatory referral sent to neurology for outpatient follow-up.   Home Health: None Equipment/Devices: None  CODE STATUS: DNR  DISCHARGE CONDITION: fair  Diet recommendation: As before  INITIAL HISTORY: 88 y.o. female with medical history significant of GIST;  glaucoma; HTN; hypothyroidism; and CKD presenting with aphasia.  She reports that she had a spell that came on.  She didn't feel right.  She felt like she needed to call her daughter.  She has had these speels before.  It affects her head, it doesn't feel right.  She can't describe it and becomes lifeless, not as alert, SOB.  The last spell before today was maybe 6 months ago according to her daughter but the patient reports she has had a couple of them more recently and she didn't mention it.  They are usually momentarily but today's episode lasted longer, maybe 20+ minutes.  By the time she was in the ER she was much more alert, awake.  She awoke about 0430 and felt normal.  Symptoms started and she got up to get coffee but still didn't feel good.  She went to the chair and felt worse.  She was awake but both arms were flaccid, she could talk with her eyes closed.  She was able to call her daughter and could report not feeling right.  Arms and legs feel weak symmetric.  Some word finding difficulty, wasn't "as fluid", more  monosyllabic than usual.  Symptoms resolved completely and she feels normal now.  No prior evaluation for these episodes.  No h/o seizures.   She was last seen by Dr. Angelena Form on 9/25.  She has reported difficulty tolerating BP medications - has not tolerated HCTZ, Coreg, hydralazine due to hypotension, fatigue.  She is still on Cozaar qhs and she was taking amlodipine QOD but this is on hold due to fatigue.  She is currently taking hydralazine 12.5 mg BID in addition to losartan.   Consultants: Neurology   Procedures: EEG.  Echocardiogram    HOSPITAL COURSE:   Possible TIA Brief episode of aphasia that resolved spontaneously within 10 to 15 minutes.  MRI did not show any stroke.  EEG is unremarkable for epileptiform activity.  CT angiogram did not show any significant stenosis. LDL is 83.  Patient started on statin.   HbA1c 5.3.  Echocardiogram shows normal systolic function. Seen by PT and OT.  No needs identified. Patient to be placed on aspirin and Plavix for 3 weeks followed by Plavix alone.   Essential hypertension Presented with significantly high blood pressures.  Subsequently noted to be hypotensive.  Had some bradycardia episodes overnight.  Not noted to be on beta-blocker. TSH noted to be normal.  Free T4 is 0.83. Cortisol level low normal at 7.6.  No clinical signs or symptoms suggestive of adrenal insufficiency.  This can be pursued in the outpatient setting. Blood pressures were low this morning but noted  to be high again subsequently. Is on losartan and hydralazine at home.  Has not been able to tolerate HCTZ, spironolactone, carvedilol, amlodipine. Given a dose of hydralazine with improvement in blood pressure.  Continue same regimen as previously.   Hypothyroidism Continue levothyroxine.  Looks like the dose was increased recently.   Glaucoma Continue latanoprost   Patient is stable.  Okay for discharge home today.   PERTINENT LABS:  The results of significant  diagnostics from this hospitalization (including imaging, microbiology, ancillary and laboratory) are listed below for reference.    Microbiology: Recent Results (from the past 240 hour(s))  Resp Panel by RT-PCR (Flu A&B, Covid) Anterior Nasal Swab     Status: None   Collection Time: 05/05/22  7:28 AM   Specimen: Anterior Nasal Swab  Result Value Ref Range Status   SARS Coronavirus 2 by RT PCR NEGATIVE NEGATIVE Final    Comment: (NOTE) SARS-CoV-2 target nucleic acids are NOT DETECTED.  The SARS-CoV-2 RNA is generally detectable in upper respiratory specimens during the acute phase of infection. The lowest concentration of SARS-CoV-2 viral copies this assay can detect is 138 copies/mL. A negative result does not preclude SARS-Cov-2 infection and should not be used as the sole basis for treatment or other patient management decisions. A negative result may occur with  improper specimen collection/handling, submission of specimen other than nasopharyngeal swab, presence of viral mutation(s) within the areas targeted by this assay, and inadequate number of viral copies(<138 copies/mL). A negative result must be combined with clinical observations, patient history, and epidemiological information. The expected result is Negative.  Fact Sheet for Patients:  EntrepreneurPulse.com.au  Fact Sheet for Healthcare Providers:  IncredibleEmployment.be  This test is no t yet approved or cleared by the [place] FDA and  has been authorized for detection and/or diagnosis of SARS-CoV-2 by FDA under an Emergency Use Authorization (EUA). This EUA will remain  in effect (meaning this test can be used) for the duration of the COVID-19 declaration under Section 564(b)(1) of the Act, 21 U.S.C.section 360bbb-3(b)(1), unless the authorization is terminated  or revoked sooner.       Influenza A by PCR NEGATIVE NEGATIVE Final   Influenza B by PCR NEGATIVE  NEGATIVE Final    Comment: (NOTE) The Xpert Xpress SARS-CoV-2/FLU/RSV plus assay is intended as an aid in the diagnosis of influenza from Nasopharyngeal swab specimens and should not be used as a sole basis for treatment. Nasal washings and aspirates are unacceptable for Xpert Xpress SARS-CoV-2/FLU/RSV testing.  Fact Sheet for Patients: EntrepreneurPulse.com.au  Fact Sheet for Healthcare Providers: IncredibleEmployment.be  This test is not yet approved or cleared by the [place] FDA and has been authorized for detection and/or diagnosis of SARS-CoV-2 by FDA under an Emergency Use Authorization (EUA). This EUA will remain in effect (meaning this test can be used) for the duration of the COVID-19 declaration under Section 564(b)(1) of the Act, 21 U.S.C. section 360bbb-3(b)(1), unless the authorization is terminated or revoked.  Performed at [facility], [place] [address], [place], [place] [address]      Labs:   Basic Metabolic Panel: Recent Labs  Lab 05/05/22 0725 05/05/22 0800 05/06/22 0342  NA 133* 133* 134*  K 4.3 4.4 3.7  CL 101 98 104  CO2 24  --  23  GLUCOSE 107* 102* 96  BUN '17 19 17  '$ CREATININE 0.81 0.80 0.86  CALCIUM 9.8  --  8.7*   Liver Function Tests: Recent Labs  Lab 05/05/22  0725  AST 22  ALT 13  ALKPHOS 55  BILITOT 0.6  PROT 6.4*  ALBUMIN 3.5    CBC: Recent Labs  Lab 05/05/22 0725 05/05/22 0800 05/06/22 0342  WBC 5.5  --  5.4  NEUTROABS 4.0  --   --   HGB 13.2 12.9 11.5*  HCT 37.7 38.0 32.7*  MCV 86.3  --  86.5  PLT 272  --  260     CBG: Recent Labs  Lab 05/05/22 0759  GLUCAP 106*     IMAGING STUDIES ECHOCARDIOGRAM COMPLETE  Result Date: 05/06/2022    ECHOCARDIOGRAM REPORT   Patient Name:   DOLLIE BRESSI Date of Exam: 05/06/2022 Medical Rec #:  [id number]        Height:       64.0 in Accession #:    [id number]       Weight:       115.0 lb Date of Birth:  [birth year]        BSA:           1.546 m Patient Age:    76 years         BP:           186/62 mmHg Patient Gender: F                HR:           70 bpm. Exam Location:  Inpatient Procedure: 2D Echo, Cardiac Doppler, Color Doppler and Intracardiac            Opacification Agent Indications:    Stroke I63.9  History:        Patient has no prior history of Echocardiogram examinations.                 TIA, Signs/Symptoms:Shortness of Breath and Chest Pain; Risk                 Factors:Hypertension.  Sonographer:    Greer Pickerel Referring Phys: [id number] JENNIFER YATES  Sonographer Comments: Technically difficult study due to poor echo windows. Image acquisition challenging due to respiratory motion. IMPRESSIONS  1. No apical thrombus with Definity contrast. Left ventricular ejection fraction, by estimation, is 55 to 60%. The left ventricle has normal function. The left ventricle has no regional wall motion abnormalities. Left ventricular diastolic parameters are consistent with Grade I diastolic dysfunction (impaired relaxation).  2. Right ventricular systolic function is low normal. The right ventricular size is normal. There is normal pulmonary artery systolic pressure.  3. The mitral valve is grossly normal. Trivial mitral valve regurgitation.  4. The aortic valve is tricuspid. Aortic valve regurgitation is not visualized.  5. The inferior vena cava is normal in size with <50% respiratory variability, suggesting right atrial pressure of 8 mmHg. Comparison(s): No prior Echocardiogram. FINDINGS  Left Ventricle: No apical thrombus with Definity contrast. Left ventricular ejection fraction, by estimation, is 55 to 60%. The left ventricle has normal function. The left ventricle has no regional wall motion abnormalities. Definity contrast agent was  given IV to delineate the left ventricular endocardial borders. The left ventricular internal cavity size was normal in size. There is no left ventricular hypertrophy. Left ventricular diastolic  parameters are consistent with Grade I diastolic dysfunction (impaired relaxation). Indeterminate filling pressures. Right Ventricle: The right ventricular size is normal. No increase in right ventricular wall thickness. Right ventricular systolic function is low normal. There is normal pulmonary artery systolic pressure. The tricuspid regurgitant velocity is 2.11 m/s,  and with an assumed right atrial pressure of 8 mmHg, the estimated right ventricular systolic pressure is 46.9 mmHg. Left Atrium: Left atrial size was normal in size. Right Atrium: Right atrial size was normal in size. Pericardium: There is no evidence of pericardial effusion. Mitral Valve: The mitral valve is grossly normal. Trivial mitral valve regurgitation. Tricuspid Valve: The tricuspid valve is grossly normal. Tricuspid valve regurgitation is trivial. Aortic Valve: The aortic valve is tricuspid. Aortic valve regurgitation is not visualized. Pulmonic Valve: The pulmonic valve was normal in structure. Pulmonic valve regurgitation is not visualized. Aorta: The aortic root and ascending aorta are structurally normal, with no evidence of dilitation. Venous: The inferior vena cava is normal in size with less than 50% respiratory variability, suggesting right atrial pressure of 8 mmHg. IAS/Shunts: The interatrial septum was not well visualized.  LEFT VENTRICLE PLAX 2D LVIDd:         3.40 cm   Diastology LVIDs:         2.40 cm   LV e' medial:    6.53 cm/s LV PW:         1.00 cm   LV E/e' medial:  12.5 LV IVS:        0.90 cm   LV e' lateral:   7.77 cm/s LVOT diam:     1.60 cm   LV E/e' lateral: 10.5 LV SV:         32 LV SV Index:   20 LVOT Area:     2.01 cm  RIGHT VENTRICLE RV S prime:     10.90 cm/s TAPSE (M-mode): 2.1 cm LEFT ATRIUM             Index        RIGHT ATRIUM           Index LA diam:        3.40 cm 2.20 cm/m   RA Area:     10.50 cm LA Vol (A2C):   23.1 ml 14.94 ml/m  RA Volume:   19.90 ml  12.87 ml/m LA Vol (A4C):   34.1 ml 22.05 ml/m  LA Biplane Vol: 28.7 ml 18.56 ml/m  AORTIC VALVE LVOT Vmax:   70.70 cm/s LVOT Vmean:  47.600 cm/s LVOT VTI:    0.157 m  AORTA Ao Root diam: 2.70 cm Ao Asc diam:  2.60 cm MITRAL VALVE               TRICUSPID VALVE MV Area (PHT): 2.95 cm    TV Peak grad:   10.0 mmHg MV Decel Time: 257 msec    TV Vmax:        1.58 m/s MV E velocity: 81.70 cm/s  TR Peak grad:   17.8 mmHg MV A velocity: 95.20 cm/s  TR Vmax:        211.00 cm/s MV E/A ratio:  0.86                            SHUNTS                            Systemic VTI:  0.16 m                            Systemic Diam: 1.60 cm Lyman Bishop MD Electronically signed by Lyman Bishop MD Signature Date/Time: 05/06/2022/4:32:48 PM    Final  EEG adult  Result Date: 05/06/2022 Lora Havens, MD     05/06/2022  9:02 AM Patient Name: ALFRIEDA TARRY MRN: [id number] Epilepsy Attending: Lora Havens Referring Physician/Provider: Greta Doom, MD Date: 05/05/2022 Duration: 21.19 mins Patient history: 88 y.o. female with past medical history of HTN, HLD, hypothyroidism, gastric cancer and glaucoma who presents to [facility] ED for trouble speaking. EEG to evaluate for seizure Level of alertness: Awake AEDs during EEG study: None Technical aspects: This EEG study was done with scalp electrodes positioned according to the 10-20 International system of electrode placement. Electrical activity was reviewed with band pass filter of 1-'70Hz'$ , sensitivity of 7 uV/mm, display speed of 35m/sec with a '60Hz'$  notched filter applied as appropriate. EEG data were recorded continuously and digitally stored.  Video monitoring was available and reviewed as appropriate. Description: No clear posterior dominant rhythm was seen.  EEG showed continuous generalized 3 to 5 Hz theta-theta slowing.  Hyperventilation and photic stimulation were not performed.   Of note, study was technically difficult due to significant myogenic artifact ABNORMALITY - Continuous slow, generalized IMPRESSION:  This technically difficult study is suggestive of moderate diffuse encephalopathy, nonspecific etiology.  No seizures or epileptiform discharges were seen throughout the recording. Please consider repeat EEG if concern for ictal-interictal activity persists. PLora Havens  MR BRAIN WO CONTRAST  Result Date: 05/05/2022 CLINICAL DATA:  Stroke, follow up EXAM: MRI HEAD WITHOUT CONTRAST TECHNIQUE: Multiplanar, multiecho pulse sequences of the brain and surrounding structures were obtained without intravenous contrast. COMPARISON:  Same day CT head. FINDINGS: Brain: No acute infarction, hemorrhage, hydrocephalus, extra-axial collection or mass lesion. Mild for age scattered T2/FLAIR hyperintensities within the white matter, which are nonspecific but compatible with chronic microvascular ischemic disease. Vascular: Better evaluated on same day CTA. Skull and upper cervical spine: Normal marrow signal. Sinuses/Orbits: Clear sinuses.  No acute orbital findings. Other: No mastoid effusions. IMPRESSION: No evidence of acute intracranial abnormality. Electronically Signed   By: FMargaretha SheffieldM.D.   On: 05/05/2022 12:29   CT ANGIO HEAD NECK W WO CM  Result Date: 05/05/2022 CLINICAL DATA:  88year old female with neurologic deficit. EXAM: CT ANGIOGRAPHY HEAD AND NECK TECHNIQUE: Multidetector CT imaging of the head and neck was performed using the standard protocol during bolus administration of intravenous contrast. Multiplanar CT image reconstructions and MIPs were obtained to evaluate the vascular anatomy. Carotid stenosis measurements (when applicable) are obtained utilizing NASCET criteria, using the distal internal carotid diameter as the denominator. RADIATION DOSE REDUCTION: This exam was performed according to the departmental dose-optimization program which includes automated exposure control, adjustment of the mA and/or kV according to patient size and/or use of iterative reconstruction technique.  CONTRAST:  713mOMNIPAQUE IOHEXOL 350 MG/ML SOLN COMPARISON:  Plain head CT 0825 hours today. Brain MRI and intracranial MRA 02/04/2010. FINDINGS: CTA NECK Skeleton: Absent dentition. Hyperostosis of the calvarium. Cervical spine degeneration although fairly age-appropriate. No acute osseous abnormality identified. Upper chest: Negative. Other neck: Partially atrophied submandibular glands. No acute finding in the neck. Aortic arch: Calcified aortic atherosclerosis. 3 vessel arch configuration. Right carotid system: Mild brachiocephalic artery plaque without stenosis. Proximal right CCA is normal. Mild calcified plaque before the bifurcation without stenosis. Mild to moderate ICA origin and bulb calcified plaque, but less than 50 % stenosis with respect to the distal vessel. Left carotid system: Similar mild for age calcified plaque through the left ICA origin without stenosis. At the distal bulb there is more moderate calcified plaque,  but still less than 50 % stenosis with respect to the distal vessel. Vertebral arteries: Mild plaque at the right subclavian artery origin without stenosis. Bulky calcified plaque adjacent to the right vertebral artery origin with mild to moderate origin stenosis. But the right vertebral artery remains patent and is otherwise normal to the skull base. Moderate calcified plaque at the left subclavian artery origin but without stenosis. Ulcerated soft plaque along the medial vessel in the mediastinum on series 8, image 158. No stenosis or dissection. Calcified plaque at the left vertebral artery origin with mild to moderate stenosis on series 8, image 155. But codominant and otherwise normal left vertebral to the skull base. CTA HEAD Posterior circulation: Distal vertebral arteries and vertebrobasilar junction are tortuous but patent without stenosis. Both PICA origins are patent. Tortuous basilar artery is patent without stenosis. Patent SCA and left PCA origins. Fetal type right PCA  origin. Left posterior communicating artery diminutive or absent. Left PCA branches are diminutive, new since 2011, but patent with no discrete stenosis identified. Larger right PCA branches with mild P2/P3 junction irregularity but no significant stenosis. Anterior circulation: Both ICA siphons are patent. Mild for age left and mild to moderate right siphon calcified plaque. Only mild bilateral supraclinoid stenosis. Patent carotid termini, patent carotid termini. Normal left MCA and ACA origins. Irregularity at both the right ACA and right MCA origins with mild to moderate stenosis on series 11, image 19. This is new since 2011. Normal anterior communicating artery. Bilateral ACA branches are stable and within normal limits. Left MCA M1 segment and bifurcation are patent without stenosis. Right MCA M1 is patent beyond the origin and to the bifurcation without stenosis. Bilateral MCA branches are stable since 2011 and within normal limits. Venous sinuses: Early contrast timing, grossly patent. Anatomic variants: Fetal right PCA origin. Review of the MIP images confirms the above findings IMPRESSION: 1. Negative for large vessel occlusion. 2. Overall mild for age atherosclerosis in the head and neck. But mild-to-moderate stenosis at the origin of both the Right ACA and MCA has developed since 2011. And similar mild-to-moderate stenosis at both vertebral artery origins. 3.  Aortic Atherosclerosis (ICD10-I70.0). Electronically Signed   By: Genevie Ann M.D.   On: 05/05/2022 09:38   CT Head Wo Contrast  Result Date: 05/05/2022 CLINICAL DATA:  Neuro deficit, acute, stroke suspected EXAM: CT HEAD WITHOUT CONTRAST TECHNIQUE: Contiguous axial images were obtained from the base of the skull through the vertex without intravenous contrast. RADIATION DOSE REDUCTION: This exam was performed according to the departmental dose-optimization program which includes automated exposure control, adjustment of the mA and/or kV  according to patient size and/or use of iterative reconstruction technique. COMPARISON:  Head CT 02/04/2010. FINDINGS: Brain: No evidence of acute intracranial hemorrhage or extra-axial collection. No loss of gray-white matter differentiation.No evidence of mass lesion/concerning mass effect.The ventricles are normal in size.Scattered subcortical and periventricular white matter hypodensities, nonspecific but likely sequela of chronic small vessel ischemic disease. Vascular: Vascular calcifications. Skull: Negative for skull fracture. Hyperostosis frontalis internus. Sinuses/Orbits: No acute finding. Other: None. IMPRESSION: No acute intracranial abnormality. Mild sequela of chronic small vessel ischemic disease. Electronically Signed   By: Maurine Simmering M.D.   On: 05/05/2022 08:32   DG Chest Port 1 View  Result Date: 05/05/2022 CLINICAL DATA:  Weakness and aphasia. EXAM: PORTABLE CHEST 1 VIEW COMPARISON:  03/02/2022 FINDINGS: 0736 hours. The lungs are clear without focal pneumonia, edema, pneumothorax or pleural effusion. The cardiopericardial silhouette is within normal  limits for size. The visualized bony structures of the thorax are unremarkable. Telemetry leads overlie the chest. IMPRESSION: No active disease. Electronically Signed   By: Misty Stanley M.D.   On: 05/05/2022 07:43    DISCHARGE EXAMINATION: See progress note from earlier today   DISPOSITION: Home  Discharge Instructions     Ambulatory referral to Neurology   Complete by: As directed    An appointment is requested in approximately: 8 weeks   Call MD for:  difficulty breathing, headache or visual disturbances   Complete by: As directed    Call MD for:  extreme fatigue   Complete by: As directed    Call MD for:  persistant dizziness or light-headedness   Complete by: As directed    Call MD for:  persistant nausea and vomiting   Complete by: As directed    Call MD for:  severe uncontrolled pain   Complete by: As directed     Call MD for:  temperature >100.4   Complete by: As directed    Diet - low sodium heart healthy   Complete by: As directed    Discharge instructions   Complete by: As directed    Take your medications as prescribed. Follow up with your PCP.  You were cared for by a hospitalist during your hospital stay. If you have any questions about your discharge medications or the care you received while you were in the hospital after you are discharged, you can call the unit and asked to speak with the hospitalist on call if the hospitalist that took care of you is not available. Once you are discharged, your primary care physician will handle any further medical issues. Please note that NO REFILLS for any discharge medications will be authorized once you are discharged, as it is imperative that you return to your primary care physician (or establish a relationship with a primary care physician if you do not have one) for your aftercare needs so that they can reassess your need for medications and monitor your lab values. If you do not have a primary care physician, you can call [phone] for a physician referral.   Increase activity slowly   Complete by: As directed           Allergies as of 05/06/2022       Reactions   Ciprocinonide [fluocinolone] Nausea And Vomiting   Causes N/V and diarrhea   Codeine Sulfate Nausea And Vomiting   Penicillins Other (See Comments)   unknown   Sulfonamide Derivatives Rash        Medication List     TAKE these medications    acetaminophen 500 MG tablet Commonly known as: TYLENOL Take 1,000 mg by mouth every 6 (six) hours as needed for mild pain.   aspirin 81 MG tablet Take 1 tablet (81 mg total) by mouth daily for 21 days. For 3 weeks only What changed: additional instructions   calcium-vitamin D 250-125 MG-UNIT tablet Commonly known as: OSCAL Take 1 tablet by mouth daily.   clopidogrel 75 MG tablet Commonly known as: PLAVIX Take 1 tablet (75 mg  total) by mouth daily. Start taking on: May 07, 2022   hydrALAZINE 25 MG tablet Commonly known as: APRESOLINE Take 25 mg by mouth daily.   latanoprost 0.005 % ophthalmic solution Commonly known as: XALATAN Place 1 drop into both eyes at bedtime.   levothyroxine 75 MCG tablet Commonly known as: SYNTHROID Take 75 mcg by mouth every morning.   losartan  100 MG tablet Commonly known as: COZAAR Take 100 mg by mouth at bedtime.   pravastatin 20 MG tablet Commonly known as: PRAVACHOL Take 1 tablet (20 mg total) by mouth daily at 6 PM.          Follow-up Information     Wile, Jesse Sans, MD Follow up.   Specialty: Internal Medicine Why: post hospitalization follow up Contact information: [place] [address] [phone]                 [place]: 25 minutes  [place]  [facility] Pager on [email or web address]  05/06/2022, 4:46 PM

## 2022-05-06 NOTE — Progress Notes (Signed)
[facility] PROGRESS NOTE   Cheryl Yoder HCW:[id number] DOB: 10/13/1927 DOA: 05/05/2022  PCP: Michael Boston, MD  Brief History/Interval Summary:  88 y.o. female with medical history significant of GIST;  glaucoma; HTN; hypothyroidism; and CKD presenting with aphasia.  She reports that she had a spell that came on.  She didn't feel right.  She felt like she needed to call her daughter.  She has had these speels before.  It affects her head, it doesn't feel right.  She can't describe it and becomes lifeless, not as alert, SOB.  The last spell before today was maybe 6 months ago according to her daughter but the patient reports she has had a couple of them more recently and she didn't mention it.  They are usually momentarily but today's episode lasted longer, maybe 20+ minutes.  By the time she was in the ER she was much more alert, awake.  She awoke about 0430 and felt normal.  Symptoms started and she got up to get coffee but still didn't feel good.  She went to the chair and felt worse.  She was awake but both arms were flaccid, she could talk with her eyes closed.  She was able to call her daughter and could report not feeling right.  Arms and legs feel weak symmetric.  Some word finding difficulty, wasn't "as fluid", more monosyllabic than usual.  Symptoms resolved completely and she feels normal now.  No prior evaluation for these episodes.  No h/o seizures.   She was last seen by Dr. Angelena Form on 9/25.  She has reported difficulty tolerating BP medications - has not tolerated HCTZ, Coreg, hydralazine due to hypotension, fatigue.  She is still on Cozaar qhs and she was taking amlodipine QOD but this is on hold due to fatigue.  She is currently taking hydralazine 12.5 mg BID in addition to losartan.   Consultants: Neurology  Procedures: EEG.  Echocardiogram    Subjective/Interval History: Patient denies any dizziness lightheadedness this morning.  Appears to have had some  bradycardia overnight.  Denies any vertiginous symptoms this morning.  Did have some dizziness last evening but she denies that it was vertigo.  Has any chest pain or shortness of breath this morning.  Her daughter is at the bedside.    Assessment/Plan:  Possible TIA Brief episode of aphasia that resolved spontaneously within 10 to 15 minutes.  MRI did not show any stroke.  EEG is unremarkable for epileptiform activity.  CT angiogram did not show any significant stenosis. LDL is 83.  HbA1c 5.3.  Echocardiogram is pending. Seen by PT and OT.  No needs identified. Waiting on neurology input this morning.  Essential hypertension Presented with significantly high blood pressures.  Subsequently noted to be hypotensive.  Had some bradycardia episodes overnight.  Not noted to be on beta-blocker. TSH noted to be normal.  Free T4 is 0.83. Cortisol level low normal at 7.6.  We will do ACTH stimulation test if she is still here in the morning.  If not this can be pursued in the outpatient setting. Blood pressures were low this morning but noted to be high again subsequently. Is on losartan and hydralazine at home.  Has not been able to tolerate HCTZ, spironolactone, carvedilol, amlodipine. Continue to monitor for now.  Hypothyroidism Continue levothyroxine.  Looks like the dose was increased recently.  Glaucoma Continue latanoprost  DVT Prophylaxis: On Lovenox Code Status: DNR Family Communication: Discussed with patient and her daughter Disposition Plan:  Hopefully return home when cleared by neurology      Medications: Scheduled:  aspirin EC  81 mg Oral Daily   clopidogrel  75 mg Oral Daily   enoxaparin (LOVENOX) injection  40 mg Subcutaneous Daily   latanoprost  1 drop Both Eyes QHS   levothyroxine  75 mcg Oral q morning   pravastatin  20 mg Oral q1800   Continuous: WER:[id number] **OR** acetaminophen (TYLENOL) oral liquid 160 mg/5 mL **OR** acetaminophen, hydrALAZINE, LORazepam,  senna-docusate  Antibiotics: Anti-infectives (From admission, onward)    None       Objective:  Vital Signs  Vitals:   05/06/22 0730 05/06/22 0832 05/06/22 1016 05/06/22 1152  BP: (!) 92/44  (!) 162/61 (!) 186/62  Pulse: 65  74 63  Resp: '14  16 16  '$ Temp:  98.6 F (37 C) (!) 97.5 F (36.4 C) 98 F (36.7 C)  TempSrc:   Oral Oral  SpO2: 97%  99% 100%  Weight:      Height:       No intake or output data in the 24 hours ending 05/06/22 1153 Filed Weights   05/05/22 0720  Weight: 52.2 kg    General appearance: Awake alert.  In no distress Resp: Clear to auscultation bilaterally.  Normal effort Cardio: S1-S2 is normal regular.  No S3-S4.  No rubs murmurs or bruit GI: Abdomen is soft.  Nontender nondistended.  Bowel sounds are present normal.  No masses organomegaly Extremities: No edema.  Full range of motion of lower extremities. Neurologic: Alert and oriented x3.  No focal neurological deficits.    Lab Results:  Data Reviewed: I have personally reviewed following labs and reports of the imaging studies  CBC: Recent Labs  Lab 05/05/22 0725 05/05/22 0800 05/06/22 0342  WBC 5.5  --  5.4  NEUTROABS 4.0  --   --   HGB 13.2 12.9 11.5*  HCT 37.7 38.0 32.7*  MCV 86.3  --  86.5  PLT 272  --  932    Basic Metabolic Panel: Recent Labs  Lab 05/05/22 0725 05/05/22 0800 05/06/22 0342  NA 133* 133* 134*  K 4.3 4.4 3.7  CL 101 98 104  CO2 24  --  23  GLUCOSE 107* 102* 96  BUN '17 19 17  '$ CREATININE 0.81 0.80 0.86  CALCIUM 9.8  --  8.7*    GFR: Estimated Creatinine Clearance: 34.4 mL/min (by C-G formula based on SCr of 0.86 mg/dL).  Liver Function Tests: Recent Labs  Lab 05/05/22 0725  AST 22  ALT 13  ALKPHOS 55  BILITOT 0.6  PROT 6.4*  ALBUMIN 3.5     Coagulation Profile: Recent Labs  Lab 05/05/22 0725  INR 1.0     HbA1C: Recent Labs    05/05/22 0725  HGBA1C 5.3    CBG: Recent Labs  Lab 05/05/22 0759  GLUCAP 106*    Lipid  Profile: Recent Labs    05/06/22 0342  CHOL 139  HDL 46  LDLCALC 83  TRIG 52  CHOLHDL 3.0    Thyroid Function Tests: Recent Labs    05/06/22 0730 05/06/22 0804  TSH  --  2.701  FREET4 0.83  --      Recent Results (from the past 240 hour(s))  Resp Panel by RT-PCR (Flu A&B, Covid) Anterior Nasal Swab     Status: None   Collection Time: 05/05/22  7:28 AM   Specimen: Anterior Nasal Swab  Result Value Ref Range Status   SARS Coronavirus  2 by RT PCR NEGATIVE NEGATIVE Final    Comment: (NOTE) SARS-CoV-2 target nucleic acids are NOT DETECTED.  The SARS-CoV-2 RNA is generally detectable in upper respiratory specimens during the acute phase of infection. The lowest concentration of SARS-CoV-2 viral copies this assay can detect is 138 copies/mL. A negative result does not preclude SARS-Cov-2 infection and should not be used as the sole basis for treatment or other patient management decisions. A negative result may occur with  improper specimen collection/handling, submission of specimen other than nasopharyngeal swab, presence of viral mutation(s) within the areas targeted by this assay, and inadequate number of viral copies(<138 copies/mL). A negative result must be combined with clinical observations, patient history, and epidemiological information. The expected result is Negative.  Fact Sheet for Patients:  EntrepreneurPulse.com.au  Fact Sheet for Healthcare Providers:  IncredibleEmployment.be  This test is no t yet approved or cleared by the [place] FDA and  has been authorized for detection and/or diagnosis of SARS-CoV-2 by FDA under an Emergency Use Authorization (EUA). This EUA will remain  in effect (meaning this test can be used) for the duration of the COVID-19 declaration under Section 564(b)(1) of the Act, 21 U.S.C.section 360bbb-3(b)(1), unless the authorization is terminated  or revoked sooner.       Influenza A  by PCR NEGATIVE NEGATIVE Final   Influenza B by PCR NEGATIVE NEGATIVE Final    Comment: (NOTE) The Xpert Xpress SARS-CoV-2/FLU/RSV plus assay is intended as an aid in the diagnosis of influenza from Nasopharyngeal swab specimens and should not be used as a sole basis for treatment. Nasal washings and aspirates are unacceptable for Xpert Xpress SARS-CoV-2/FLU/RSV testing.  Fact Sheet for Patients: EntrepreneurPulse.com.au  Fact Sheet for Healthcare Providers: IncredibleEmployment.be  This test is not yet approved or cleared by the [place] FDA and has been authorized for detection and/or diagnosis of SARS-CoV-2 by FDA under an Emergency Use Authorization (EUA). This EUA will remain in effect (meaning this test can be used) for the duration of the COVID-19 declaration under Section 564(b)(1) of the Act, 21 U.S.C. section 360bbb-3(b)(1), unless the authorization is terminated or revoked.  Performed at [facility], [place] [address], [place], [place] [address]       Radiology Studies: EEG adult  Result Date: 2022-06-04 Lora Havens, MD     06/04/2022  9:02 AM Patient Name: Cheryl Yoder MRN: [id number] Epilepsy Attending: Lora Havens Referring Physician/Provider: Greta Doom, MD Date: 05/05/2022 Duration: 21.19 mins Patient history: 88 y.o. female with past medical history of HTN, HLD, hypothyroidism, gastric cancer and glaucoma who presents to [facility] ED for trouble speaking. EEG to evaluate for seizure Level of alertness: Awake AEDs during EEG study: None Technical aspects: This EEG study was done with scalp electrodes positioned according to the 10-20 International system of electrode placement. Electrical activity was reviewed with band pass filter of 1-'70Hz'$ , sensitivity of 7 uV/mm, display speed of 42m/sec with a '60Hz'$  notched filter applied as appropriate. EEG data were recorded continuously and digitally stored.  Video  monitoring was available and reviewed as appropriate. Description: No clear posterior dominant rhythm was seen.  EEG showed continuous generalized 3 to 5 Hz theta-theta slowing.  Hyperventilation and photic stimulation were not performed.   Of note, study was technically difficult due to significant myogenic artifact ABNORMALITY - Continuous slow, generalized IMPRESSION: This technically difficult study is suggestive of moderate diffuse encephalopathy, nonspecific etiology.  No seizures or epileptiform discharges were seen throughout the  recording. Please consider repeat EEG if concern for ictal-interictal activity persists. Lora Havens   MR BRAIN WO CONTRAST  Result Date: 05/05/2022 CLINICAL DATA:  Stroke, follow up EXAM: MRI HEAD WITHOUT CONTRAST TECHNIQUE: Multiplanar, multiecho pulse sequences of the brain and surrounding structures were obtained without intravenous contrast. COMPARISON:  Same day CT head. FINDINGS: Brain: No acute infarction, hemorrhage, hydrocephalus, extra-axial collection or mass lesion. Mild for age scattered T2/FLAIR hyperintensities within the white matter, which are nonspecific but compatible with chronic microvascular ischemic disease. Vascular: Better evaluated on same day CTA. Skull and upper cervical spine: Normal marrow signal. Sinuses/Orbits: Clear sinuses.  No acute orbital findings. Other: No mastoid effusions. IMPRESSION: No evidence of acute intracranial abnormality. Electronically Signed   By: Margaretha Sheffield M.D.   On: 05/05/2022 12:29   CT ANGIO HEAD NECK W WO CM  Result Date: 05/05/2022 CLINICAL DATA:  88 year old female with neurologic deficit. EXAM: CT ANGIOGRAPHY HEAD AND NECK TECHNIQUE: Multidetector CT imaging of the head and neck was performed using the standard protocol during bolus administration of intravenous contrast. Multiplanar CT image reconstructions and MIPs were obtained to evaluate the vascular anatomy. Carotid stenosis measurements (when  applicable) are obtained utilizing NASCET criteria, using the distal internal carotid diameter as the denominator. RADIATION DOSE REDUCTION: This exam was performed according to the departmental dose-optimization program which includes automated exposure control, adjustment of the mA and/or kV according to patient size and/or use of iterative reconstruction technique. CONTRAST:  21m OMNIPAQUE IOHEXOL 350 MG/ML SOLN COMPARISON:  Plain head CT 0825 hours today. Brain MRI and intracranial MRA 02/04/2010. FINDINGS: CTA NECK Skeleton: Absent dentition. Hyperostosis of the calvarium. Cervical spine degeneration although fairly age-appropriate. No acute osseous abnormality identified. Upper chest: Negative. Other neck: Partially atrophied submandibular glands. No acute finding in the neck. Aortic arch: Calcified aortic atherosclerosis. 3 vessel arch configuration. Right carotid system: Mild brachiocephalic artery plaque without stenosis. Proximal right CCA is normal. Mild calcified plaque before the bifurcation without stenosis. Mild to moderate ICA origin and bulb calcified plaque, but less than 50 % stenosis with respect to the distal vessel. Left carotid system: Similar mild for age calcified plaque through the left ICA origin without stenosis. At the distal bulb there is more moderate calcified plaque, but still less than 50 % stenosis with respect to the distal vessel. Vertebral arteries: Mild plaque at the right subclavian artery origin without stenosis. Bulky calcified plaque adjacent to the right vertebral artery origin with mild to moderate origin stenosis. But the right vertebral artery remains patent and is otherwise normal to the skull base. Moderate calcified plaque at the left subclavian artery origin but without stenosis. Ulcerated soft plaque along the medial vessel in the mediastinum on series 8, image 158. No stenosis or dissection. Calcified plaque at the left vertebral artery origin with mild to  moderate stenosis on series 8, image 155. But codominant and otherwise normal left vertebral to the skull base. CTA HEAD Posterior circulation: Distal vertebral arteries and vertebrobasilar junction are tortuous but patent without stenosis. Both PICA origins are patent. Tortuous basilar artery is patent without stenosis. Patent SCA and left PCA origins. Fetal type right PCA origin. Left posterior communicating artery diminutive or absent. Left PCA branches are diminutive, new since 2011, but patent with no discrete stenosis identified. Larger right PCA branches with mild P2/P3 junction irregularity but no significant stenosis. Anterior circulation: Both ICA siphons are patent. Mild for age left and mild to moderate right siphon calcified plaque. Only mild  bilateral supraclinoid stenosis. Patent carotid termini, patent carotid termini. Normal left MCA and ACA origins. Irregularity at both the right ACA and right MCA origins with mild to moderate stenosis on series 11, image 19. This is new since 2011. Normal anterior communicating artery. Bilateral ACA branches are stable and within normal limits. Left MCA M1 segment and bifurcation are patent without stenosis. Right MCA M1 is patent beyond the origin and to the bifurcation without stenosis. Bilateral MCA branches are stable since 2011 and within normal limits. Venous sinuses: Early contrast timing, grossly patent. Anatomic variants: Fetal right PCA origin. Review of the MIP images confirms the above findings IMPRESSION: 1. Negative for large vessel occlusion. 2. Overall mild for age atherosclerosis in the head and neck. But mild-to-moderate stenosis at the origin of both the Right ACA and MCA has developed since 2011. And similar mild-to-moderate stenosis at both vertebral artery origins. 3.  Aortic Atherosclerosis (ICD10-I70.0). Electronically Signed   By: Genevie Ann M.D.   On: 05/05/2022 09:38   CT Head Wo Contrast  Result Date: 05/05/2022 CLINICAL DATA:  Neuro  deficit, acute, stroke suspected EXAM: CT HEAD WITHOUT CONTRAST TECHNIQUE: Contiguous axial images were obtained from the base of the skull through the vertex without intravenous contrast. RADIATION DOSE REDUCTION: This exam was performed according to the departmental dose-optimization program which includes automated exposure control, adjustment of the mA and/or kV according to patient size and/or use of iterative reconstruction technique. COMPARISON:  Head CT 02/04/2010. FINDINGS: Brain: No evidence of acute intracranial hemorrhage or extra-axial collection. No loss of gray-white matter differentiation.No evidence of mass lesion/concerning mass effect.The ventricles are normal in size.Scattered subcortical and periventricular white matter hypodensities, nonspecific but likely sequela of chronic small vessel ischemic disease. Vascular: Vascular calcifications. Skull: Negative for skull fracture. Hyperostosis frontalis internus. Sinuses/Orbits: No acute finding. Other: None. IMPRESSION: No acute intracranial abnormality. Mild sequela of chronic small vessel ischemic disease. Electronically Signed   By: Maurine Simmering M.D.   On: 05/05/2022 08:32   DG Chest Port 1 View  Result Date: 05/05/2022 CLINICAL DATA:  Weakness and aphasia. EXAM: PORTABLE CHEST 1 VIEW COMPARISON:  03/02/2022 FINDINGS: 0736 hours. The lungs are clear without focal pneumonia, edema, pneumothorax or pleural effusion. The cardiopericardial silhouette is within normal limits for size. The visualized bony structures of the thorax are unremarkable. Telemetry leads overlie the chest. IMPRESSION: No active disease. Electronically Signed   By: Misty Stanley M.D.   On: 05/05/2022 07:43       LOS: 0 days   [facility] Pager on [email or web address]  05/06/2022, 11:53 AM

## 2022-05-06 NOTE — Progress Notes (Addendum)
STROKE TEAM PROGRESS NOTE   INTERVAL HISTORY No family at the bedside. Did have some orthostatic hypotension with PT today. Now wearing TED hose. She would like to be discharged.   Vitals:   05/06/22 0600 05/06/22 0630 05/06/22 0730 05/06/22 0832  BP: (!) 115/46 (!) 104/48 (!) 92/44   Pulse: 63 (!) 41 65   Resp:  15 14   Temp:    98.6 F (37 C)  TempSrc:      SpO2: 96% 99% 97%   Weight:      Height:       CBC:  Recent Labs  Lab 05/05/22 0725 05/05/22 0800 05/06/22 0342  WBC 5.5  --  5.4  NEUTROABS 4.0  --   --   HGB 13.2 12.9 11.5*  HCT 37.7 38.0 32.7*  MCV 86.3  --  86.5  PLT 272  --  865   Basic Metabolic Panel:  Recent Labs  Lab 05/05/22 0725 05/05/22 0800 05/06/22 0342  NA 133* 133* 134*  K 4.3 4.4 3.7  CL 101 98 104  CO2 24  --  23  GLUCOSE 107* 102* 96  BUN '17 19 17  '$ CREATININE 0.81 0.80 0.86  CALCIUM 9.8  --  8.7*   Lipid Panel:  Recent Labs  Lab 05/06/22 0342  CHOL 139  TRIG 52  HDL 46  CHOLHDL 3.0  VLDL 10  LDLCALC 83   HgbA1c:  Recent Labs  Lab 05/05/22 0725  HGBA1C 5.3   Urine Drug Screen: No results for input(s): "LABOPIA", "COCAINSCRNUR", "LABBENZ", "AMPHETMU", "THCU", "LABBARB" in the last 168 hours.  Alcohol Level No results for input(s): "ETH" in the last 168 hours.  IMAGING past 24 hours MR BRAIN WO CONTRAST  Result Date: 05/05/2022 CLINICAL DATA:  Stroke, follow up EXAM: MRI HEAD WITHOUT CONTRAST TECHNIQUE: Multiplanar, multiecho pulse sequences of the brain and surrounding structures were obtained without intravenous contrast. COMPARISON:  Same day CT head. FINDINGS: Brain: No acute infarction, hemorrhage, hydrocephalus, extra-axial collection or mass lesion. Mild for age scattered T2/FLAIR hyperintensities within the white matter, which are nonspecific but compatible with chronic microvascular ischemic disease. Vascular: Better evaluated on same day CTA. Skull and upper cervical spine: Normal marrow signal. Sinuses/Orbits: Clear  sinuses.  No acute orbital findings. Other: No mastoid effusions. IMPRESSION: No evidence of acute intracranial abnormality. Electronically Signed   By: Margaretha Sheffield M.D.   On: 05/05/2022 12:29   CT ANGIO HEAD NECK W WO CM  Result Date: 05/05/2022 CLINICAL DATA:  88 year old female with neurologic deficit. EXAM: CT ANGIOGRAPHY HEAD AND NECK TECHNIQUE: Multidetector CT imaging of the head and neck was performed using the standard protocol during bolus administration of intravenous contrast. Multiplanar CT image reconstructions and MIPs were obtained to evaluate the vascular anatomy. Carotid stenosis measurements (when applicable) are obtained utilizing NASCET criteria, using the distal internal carotid diameter as the denominator. RADIATION DOSE REDUCTION: This exam was performed according to the departmental dose-optimization program which includes automated exposure control, adjustment of the mA and/or kV according to patient size and/or use of iterative reconstruction technique. CONTRAST:  36m OMNIPAQUE IOHEXOL 350 MG/ML SOLN COMPARISON:  Plain head CT 0825 hours today. Brain MRI and intracranial MRA 02/04/2010. FINDINGS: CTA NECK Skeleton: Absent dentition. Hyperostosis of the calvarium. Cervical spine degeneration although fairly age-appropriate. No acute osseous abnormality identified. Upper chest: Negative. Other neck: Partially atrophied submandibular glands. No acute finding in the neck. Aortic arch: Calcified aortic atherosclerosis. 3 vessel arch configuration. Right carotid system: Mild  brachiocephalic artery plaque without stenosis. Proximal right CCA is normal. Mild calcified plaque before the bifurcation without stenosis. Mild to moderate ICA origin and bulb calcified plaque, but less than 50 % stenosis with respect to the distal vessel. Left carotid system: Similar mild for age calcified plaque through the left ICA origin without stenosis. At the distal bulb there is more moderate calcified  plaque, but still less than 50 % stenosis with respect to the distal vessel. Vertebral arteries: Mild plaque at the right subclavian artery origin without stenosis. Bulky calcified plaque adjacent to the right vertebral artery origin with mild to moderate origin stenosis. But the right vertebral artery remains patent and is otherwise normal to the skull base. Moderate calcified plaque at the left subclavian artery origin but without stenosis. Ulcerated soft plaque along the medial vessel in the mediastinum on series 8, image 158. No stenosis or dissection. Calcified plaque at the left vertebral artery origin with mild to moderate stenosis on series 8, image 155. But codominant and otherwise normal left vertebral to the skull base. CTA HEAD Posterior circulation: Distal vertebral arteries and vertebrobasilar junction are tortuous but patent without stenosis. Both PICA origins are patent. Tortuous basilar artery is patent without stenosis. Patent SCA and left PCA origins. Fetal type right PCA origin. Left posterior communicating artery diminutive or absent. Left PCA branches are diminutive, new since 2011, but patent with no discrete stenosis identified. Larger right PCA branches with mild P2/P3 junction irregularity but no significant stenosis. Anterior circulation: Both ICA siphons are patent. Mild for age left and mild to moderate right siphon calcified plaque. Only mild bilateral supraclinoid stenosis. Patent carotid termini, patent carotid termini. Normal left MCA and ACA origins. Irregularity at both the right ACA and right MCA origins with mild to moderate stenosis on series 11, image 19. This is new since 2011. Normal anterior communicating artery. Bilateral ACA branches are stable and within normal limits. Left MCA M1 segment and bifurcation are patent without stenosis. Right MCA M1 is patent beyond the origin and to the bifurcation without stenosis. Bilateral MCA branches are stable since 2011 and within  normal limits. Venous sinuses: Early contrast timing, grossly patent. Anatomic variants: Fetal right PCA origin. Review of the MIP images confirms the above findings IMPRESSION: 1. Negative for large vessel occlusion. 2. Overall mild for age atherosclerosis in the head and neck. But mild-to-moderate stenosis at the origin of both the Right ACA and MCA has developed since 2011. And similar mild-to-moderate stenosis at both vertebral artery origins. 3.  Aortic Atherosclerosis (ICD10-I70.0). Electronically Signed   By: Genevie Ann M.D.   On: 05/05/2022 09:38    PHYSICAL EXAM  Physical Exam  Constitutional: Appears well-developed and well-nourished.   Cardiovascular: Normal rate and regular rhythm.  Respiratory: Effort normal, non-labored breathing  Neuro: Mental Status: Patient is awake, alert, oriented to person, place, month, year, and situation. Patient is able to give a clear and coherent history. No signs of aphasia or neglect Cranial Nerves: II: Visual Fields are full. Pupils are equal, round, and reactive to light.   III,IV, VI: EOMI without ptosis or diploplia.  V: Facial sensation is symmetric to temperature VII: Facial movement is symmetric resting and smiling VIII: Hearing is intact to voice X: Palate elevates symmetrically XI: Shoulder shrug is symmetric. XII: Tongue protrudes midline without atrophy or fasciculations.  Motor: Tone is normal. Bulk is normal. 5/5 strength was present in all four extremities.  Sensory: Sensation is symmetric to light touch and temperature  in the arms and legs. No extinction to DSS present.  Cerebellar: FNF and HKS are intact bilaterally    ASSESSMENT/PLAN Ms. Cheryl Yoder is a 88 y.o. female with history of HTN, HLD, hypothyroidism, gastric cancer and glaucoma who presents to [facility] ED for trouble speaking that has now resolved.   Likely syncope with orthostasis, TIA is in the DDx Code Stroke CT head No acute abnormality. Small vessel disease.  Atrophy. ASPECTS 10.    CTA head & neck Overall mild for age atherosclerosis in the head and neck. But mild-to-moderate stenosis at the origin of both the Right ACA and MCA has developed since 2011. And similar mild-to-moderate stenosis at both vertebral artery origins. MRI  No acute intracranial abnormality  2D Echo EF 55 to 60% EEG negative LDL 83 HgbA1c 5.3 VTE prophylaxis - lovenox aspirin 81 mg daily prior to admission, now on aspirin 81 mg daily and clopidogrel 75 mg daily for 3 weeks and then aspirin 81 mg alone Therapy recommendations:  No follow up needed Disposition:  home  Hypertension Orthostatic hypotension Home meds:  losartan, hydralazine Stable TED hose due to orthostatic hypotension Long-term BP goal normotensive  Hyperlipidemia LDL 83, goal < 70 Add Pravastatin '20mg'$ , no high intensity statin needed given LDL near goal and advanced age Continue statin at discharge  Other Stroke Risk Factors Advanced Age >/= 55   Other Active Problems Hypothyroidism Continue home regimen   Hospital day # 0  Patient seen and examined by NP/APP with MD. MD to update note as needed.   Janine Ores, DNP, FNP-BC [facility] Pager: [phone]  ATTENDING NOTE: I reviewed above note and agree with the assessment and plan. Pt was seen and examined.   88 year old female with history of hypertension, hyperlipidemia, gastric cancer, glaucoma admitted for episode of generalized weakness, not feeling well, possible passed out.  Patient stated that she she got up this morning, did not feel well, in kitchen with coffee, she had strange feeling in the head, and possible passed out.  She woke up in ambulance, gradually recovered overnight.  Currently patient asymptomatic.  EEG no seizure, MRI negative for stroke.  CTA head and neck showed mild to moderate bilateral ICA origin right MCA origin and the bilateral VA origin stenosis.  EF 55 to 60%, A1c 5.3, LDL 83, creatinine 0.86,  hemoglobin 11.5.  On exam, patient neurologically intact.  She worked with OT this morning and showed mild orthostatic hypotension on immediate standing.  Symptoms were concerning for syncope with orthostasis, less likely TIA.  However, given risk factors, recommend aspirin 81 and Plavix DAPT for 3 weeks and then go back to home aspirin.  Add pravastatin 20 low-dose statin given advanced age and LDL near goal.  PT/OT no recommendation.  For detailed assessment and plan, please refer to above/below as I have made changes wherever appropriate.   Neurology will sign off. Please call with questions. Thanks for the consult.   Cheryl Hawking, MD PhD Stroke Neurology 05/06/2022 11:47 PM     To contact Stroke Continuity provider, please refer to [email or web address] After hours, contact General Neurology

## 2022-05-06 NOTE — ED Notes (Addendum)
Pt brady's into the 30's but HR comes right back up to 50-60. Pt asymptomatic, states "I feel fine"  Kristopher Oppenheim, MD notified. No new orders at this time.

## 2022-05-12 DIAGNOSIS — G459 Transient cerebral ischemic attack, unspecified: Secondary | ICD-10-CM | POA: Diagnosis not present

## 2022-05-12 DIAGNOSIS — I129 Hypertensive chronic kidney disease with stage 1 through stage 4 chronic kidney disease, or unspecified chronic kidney disease: Secondary | ICD-10-CM | POA: Diagnosis not present

## 2022-05-12 DIAGNOSIS — H409 Unspecified glaucoma: Secondary | ICD-10-CM | POA: Diagnosis not present

## 2022-05-12 DIAGNOSIS — E039 Hypothyroidism, unspecified: Secondary | ICD-10-CM | POA: Diagnosis not present

## 2022-05-13 NOTE — Progress Notes (Deleted)
Cardiology Office Note:    Date:  05/13/2022   ID:  Cheryl Yoder, DOB May 28, 1928, MRN [id number]  PCP:  Michael Boston, MD  [facility] Providers Cardiologist:  None { Click to update primary MD,subspecialty MD or APP then REFRESH:1}  *** Referring MD: Michael Boston, MD   Chief Complaint:  No chief complaint on file. {Click here for Visit Info    :1}   Patient Profile: Hypertension  Intol of HCTZ, Coreg, Hydralazine - ? BP Chronic kidney disease  Hypothyroidism  GIST Glaucoma  Aortic atherosclerosis    Prior CV Studies: ECHO COMPLETE WITH IMAGING ENHANCING AGENT 05/06/2022 1. No apical thrombus with Definity contrast. Left ventricular ejection fraction, by estimation, is 55 to 60%. The left ventricle has normal function. The left ventricle has no regional wall motion abnormalities. Left ventricular diastolic parameters are consistent with Grade I diastolic dysfunction (impaired relaxation). 2. Right ventricular systolic function is low normal. The right ventricular size is normal. There is normal pulmonary artery systolic pressure. 3. The mitral valve is grossly normal. Trivial mitral valve regurgitation. 4. The aortic valve is tricuspid. Aortic valve regurgitation is not visualized. 5. The inferior vena cava is normal in size with <50% respiratory variability, suggesting right atrial pressure of 8 mmHg. the estimated right ventricular systolic pressure is 88.4 mmHg.    Renal Artery [place] 01/09/15 No significant RA stenosis   History of Present Illness:   Cheryl Yoder is a 88 y.o. female with the above problem list.  She was evaluated by Dr. Angelena Form for chest pain 04/14/22. Her chest pain had resolved and it was felt that she did not need further testing. She was to f/u prn.   She was admitted 10/16-10/17 with a suspected TIA. EF was normal on echocardiogram. She had labile BPs. Initially it was elevated upon presentation. She later had issues with hypotension  and bradycardia. Cortisol level was low normal.      ***    Past Medical History:  Diagnosis Date   Cataract    Chest pain    GIST (gastrointestinal stromal tumor), malignant ([place]) 2008   Glaucoma    Hypertension    Hypothyroid    Kidney disease    Sensorineural hearing loss    Current Medications: No outpatient medications have been marked as taking for the 05/14/22 encounter (Appointment) with Richardson Dopp T, PA-C.    Allergies:   Ciprocinonide [fluocinolone], Codeine sulfate, Penicillins, and Sulfonamide derivatives   Social History   Tobacco Use   Smoking status: Never   Smokeless tobacco: Never  Vaping Use   Vaping Use: Never used  Substance Use Topics   Alcohol use: No   Drug use: No    Family Hx: The patient's family history includes Arthritis in her sister and son; Cancer in her cousin; Diabetes in her mother and son; Rheum arthritis in her mother. There is no history of Stroke or Seizures.  ROS   EKGs/Labs/Other Test Reviewed:    EKG:  EKG is *** ordered today.  The ekg ordered today demonstrates ***  Recent Labs: 05/05/2022: ALT 13 05/06/2022: BUN 17; Creatinine, Ser 0.86; Hemoglobin 11.5; Platelets 260; Potassium 3.7; Sodium 134; TSH 2.701   Recent Lipid Panel Recent Labs    05/06/22 0342  CHOL 139  TRIG 52  HDL 46  VLDL 10  LDLCALC 83     Risk Assessment/Calculations/Metrics:   {Does this patient have ATRIAL FIBRILLATION?:[id number]}     No BP recorded.  {Refresh  Note OR Click here to enter BP  :1}***    Physical Exam:    VS:  There were no vitals taken for this visit.    Wt Readings from Last 3 Encounters:  05/05/22 115 lb (52.2 kg)  04/14/22 113 lb 9.6 oz (51.5 kg)  05/31/13 151 lb 12.8 oz (68.9 kg)    Physical Exam ***     ASSESSMENT & PLAN:   No problem-specific Assessment & Plan notes found for this encounter.        {Are you ordering a CV Procedure (e.g. stress test, cath, DCCV, TEE, etc)?   Press F2        :[id number]}    Dispo:  No follow-ups on file.   Medication Adjustments/Labs and Tests Ordered: Current medicines are reviewed at length with the patient today.  Concerns regarding medicines are outlined above.  Tests Ordered: No orders of the defined types were placed in this encounter.  Medication Changes: No orders of the defined types were placed in this encounter.  Signed, Richardson Dopp, PA-C  05/13/2022 9:35 PM    [address], [place], [place]  [address] Phone: [phone]; Fax: [phone]

## 2022-05-14 ENCOUNTER — Ambulatory Visit: Payer: PPO | Attending: Physician Assistant | Admitting: Physician Assistant

## 2022-05-14 ENCOUNTER — Other Ambulatory Visit: Payer: Self-pay | Admitting: Physician Assistant

## 2022-05-14 ENCOUNTER — Encounter: Payer: Self-pay | Admitting: *Deleted

## 2022-05-14 ENCOUNTER — Ambulatory Visit: Payer: PPO | Admitting: Physician Assistant

## 2022-05-14 ENCOUNTER — Encounter: Payer: Self-pay | Admitting: Physician Assistant

## 2022-05-14 VITALS — BP 150/62 | HR 72 | Ht 63.0 in | Wt 116.8 lb

## 2022-05-14 DIAGNOSIS — E778 Other disorders of glycoprotein metabolism: Secondary | ICD-10-CM | POA: Diagnosis not present

## 2022-05-14 DIAGNOSIS — I10 Essential (primary) hypertension: Secondary | ICD-10-CM

## 2022-05-14 DIAGNOSIS — G459 Transient cerebral ischemic attack, unspecified: Secondary | ICD-10-CM

## 2022-05-14 DIAGNOSIS — R001 Bradycardia, unspecified: Secondary | ICD-10-CM

## 2022-05-14 NOTE — Patient Instructions (Signed)
Medication Instructions:  Your physician recommends that you continue on your current medications as directed. Please refer to the Current Medication list given to you today.  *If you need a refill on your cardiac medications before your next appointment, please call your pharmacy*   Lab Work: None ordered  If you have labs (blood work) drawn today and your tests are completely normal, you will receive your results only by: [place] (if you have MyChart) OR A paper copy in the mail If you have any lab test that is abnormal or we need to change your treatment, we will call you to review the results.   Testing/Procedures: [employer] Cardiac Event Monitor Instructions Your physician has requested you wear your cardiac event monitor for  30 days, (1-30). [employer] may call or text to confirm a shipping address. The monitor will be sent to a land address via UPS. [employer] will not ship a monitor to a PO BOX. It typically takes 3-5 days to receive your monitor after it has been enrolled. [employer] will assist with USPS tracking if your package is delayed. The telephone number for [employer] is [phone]. Once you have received your monitor, please review the enclosed instructions. Instruction tutorials can also be viewed under help and settings on the enclosed cell phone. Your monitor has already been registered assigning a specific monitor serial # to you.  Applying the monitor Remove cell phone from case and turn it on. The cell phone works as Dealer and needs to be within [employer] of you at all times. The cell phone will need to be charged on a daily basis. We recommend you plug the cell phone into the enclosed charger at your bedside table every night.  Monitor batteries: You will receive two monitor batteries labelled #1 and #2. These are your recorders. Plug battery #2 onto the second connection on the enclosed charger. Keep one battery on the charger at all  times. This will keep the monitor battery deactivated. It will also keep it fully charged for when you need to switch your monitor batteries. A small light will be blinking on the battery emblem when it is charging. The light on the battery emblem will remain on when the battery is fully charged.  Open package of a Monitor strip. Insert battery #1 into black hood on strip and gently squeeze monitor battery onto connection as indicated in instruction booklet. Set aside while preparing skin.  Choose location for your strip, vertical or horizontal, as indicated in the instruction booklet. Shave to remove all hair from location. There cannot be any lotions, oils, powders, or colognes on skin where monitor is to be applied. Wipe skin clean with enclosed Saline wipe. Dry skin completely.  Peel paper labeled #1 off the back of the Monitor strip exposing the adhesive. Place the monitor on the chest in the vertical or horizontal position shown in the instruction booklet. One arrow on the monitor strip must be pointing upward. Carefully remove paper labeled #2, attaching remainder of strip to your skin. Try not to create any folds or wrinkles in the strip as you apply it.  Firmly press and release the circle in the center of the monitor battery. You will hear a small beep. This is turning the monitor battery on. The heart emblem on the monitor battery will light up every 5 seconds if the monitor battery in turned on and connected to the patient securely. Do not push and hold the circle down as this turns  the monitor battery off. The cell phone will locate the monitor battery. A screen will appear on the cell phone checking the connection of your monitor strip. This may read poor connection initially but change to good connection within the next minute. Once your monitor accepts the connection you will hear a series of 3 beeps followed by a climbing crescendo of beeps. A screen will appear on the cell  phone showing the two monitor strip placement options. Touch the picture that demonstrates where you applied the monitor strip.  Your monitor strip and battery are waterproof. You are able to shower, bathe, or swim with the monitor on. They just ask you do not submerge deeper than 3 feet underwater. We recommend removing the monitor if you are swimming in a lake, river, or ocean.  Your monitor battery will need to be switched to a fully charged monitor battery approximately once a week. The cell phone will alert you of an action which needs to be made.  On the cell phone, tap for details to reveal connection status, monitor battery status, and cell phone battery status. The green dots indicates your monitor is in good status. A red dot indicates there is something that needs your attention.  To record a symptom, click the circle on the monitor battery. In 30-60 seconds a list of symptoms will appear on the cell phone. Select your symptom and tap save. Your monitor will record a sustained or significant arrhythmia regardless of you clicking the button. Some patients do not feel the heart rhythm irregularities. [employer] will notify us of any serious or critical events.  Refer to instruction booklet for instructions on switching batteries, changing strips, the Do not disturb or Pause features, or any additional questions.  Call [employer] at [phone], to confirm your monitor is transmitting and record your baseline. They will answer any questions you may have regarding the monitor instructions at that time.  Returning the monitor to [place] all equipment back into blue box. Peel off strip of paper to expose adhesive and close box securely. There is a prepaid UPS shipping label on this box. Drop in a UPS drop box, or at a UPS facility like [employer]. You may also contact [employer] to arrange UPS to pick up monitor package at your home.   Follow-Up: At [facility], you and your health needs are our priority.  As part of our continuing mission to provide you with exceptional heart care, we have created designated Provider Care Teams.  These Care Teams include your primary Cardiologist (physician) and Advanced Practice Providers (APPs -  Physician Assistants and Nurse Practitioners) who all work together to provide you with the care you need, when you need it.  We recommend signing up for the patient portal called "MyChart".  Sign up information is provided on this After Visit Summary.  MyChart is used to connect with patients for Virtual Visits (Telemedicine).  Patients are able to view lab/test results, encounter notes, upcoming appointments, etc.  Non-urgent messages can be sent to your provider as well.   To learn more about what you can do with MyChart, go to [email or web address].    Your next appointment:   6-8 week(s)  07/04/22 ARRIVE AT 10:15   The format for your next appointment:   In Person  Provider:   Lauree Chandler, MD  or Richardson Dopp, PA-C         Other Instructions   Important Information About Sugar

## 2022-05-14 NOTE — Progress Notes (Signed)
Patient ID: Cheryl Yoder, female   DOB: [birth year], 88 y.o.   MRN: [id number] Patient enrolled for [employer] to ship a 30 day cardiac event monitor to her address on file.  Dr. Angelena Form to read.

## 2022-05-14 NOTE — Progress Notes (Signed)
BP was not repeated. Pt has sensitivity to meds; low BP at times - Will try to keep < 446 systolic

## 2022-05-14 NOTE — Assessment & Plan Note (Signed)
Total protein on labs in the hospital was slightly low.  Albumin is low normal.  I have recommended that she try to increase calories in her diet to help with this.  I have also recommended Ensure or Boost.

## 2022-05-14 NOTE — Assessment & Plan Note (Signed)
I did not repeat her blood pressure today.  Blood pressure elevated.  She is getting similar readings at home.  She has also had some low blood pressures.  She is sensitive to multiple medications.  We will try to keep her blood pressure at least below 160.  I have advised her to take hydralazine 25 mg extra if systolic blood pressures above 160.  Continue to monitor for now.

## 2022-05-14 NOTE — Assessment & Plan Note (Signed)
She was recently admitted for probable TIA.  She has had 1 other episode since discharge from the hospital.  MRI did not demonstrate any acute stroke.  She did not have any severe stenosis noted on head neck CTA.  There was no mention of atrial fibrillation on telemetry.  Her EKG demonstrated normal sinus rhythm.  TSH was normal.  Echocardiogram demonstrated normal LV function and no cardiac source of embolus. 30-day event monitor Follow-up 6 to 8 weeks If monitor negative and she continues to have spells, consider ILR Follow-up with neurology as planned

## 2022-05-14 NOTE — Progress Notes (Signed)
Cardiology Office Note:    Date:  05/14/2022   ID:  Cheryl Yoder, DOB 03/25/1928, MRN [id number]  PCP:  Michael Boston, MD  [facility] Cardiologist:  Lauree Chandler, MD     Referring MD: Michael Boston, MD   Chief Complaint:  Hospitalization Follow-up (Admitted with probable TIA)    Patient Profile: Hypertension  Intol of HCTZ, Coreg, Hydralazine - ? BP Chronic kidney disease  Hypothyroidism  Hx of TIA  GIST Glaucoma  Aortic atherosclerosis    Prior CV Studies: ECHO COMPLETE WITH IMAGING ENHANCING AGENT 05/06/2022 No apical thrombus, EF 55-60, no RWMA, G1 DD, low normal RVSF, normal PASP, trivial MR, RAP 8, RVSP 25.8    Renal Artery [place] 01/09/15 No significant RA stenosis   History of Present Illness:   Cheryl Yoder is a 88 y.o. female with the above problem list.  She was evaluated by Dr. Angelena Form for chest pain 04/14/22. Her chest pain had resolved and it was felt that she did not need further testing. She was to f/u prn.   She was admitted 10/16-10/17 with a suspected TIA. EF was normal on echocardiogram. She had labile BPs. Initially it was elevated upon presentation. She later had issues with hypotension and bradycardia. Cortisol level was low normal.      She is here today with her daughter.  She has had 1 other spell since coming home from the hospital.  She generally has a period of mental status change.  She is able to speak but does not have a clear recollection of exactly what happened during this time.  She is extremely exhausted afterwards.  She has not had syncope.  She has not had any recent chest pain, significant shortness of breath, orthopnea or leg edema.    Past Medical History:  Diagnosis Date   Cataract    Chest pain    GIST (gastrointestinal stromal tumor), malignant ([place]) 2008   Glaucoma    Hypertension    Hypothyroid    Kidney disease    Sensorineural hearing loss    Current Medications: Current Meds   Medication Sig   acetaminophen (TYLENOL) 500 MG tablet Take 1,000 mg by mouth every 6 (six) hours as needed for mild pain.   aspirin 81 MG tablet Take 1 tablet (81 mg total) by mouth daily for 21 days. For 3 weeks only   Calcium Carb-Cholecalciferol (CALCIUM-VITAMIN D3) 250-3.125 MG-MCG TABS 1 tablet with a meal Orally Once a day   calcium-vitamin D (OSCAL) 250-125 MG-UNIT per tablet Take 1 tablet by mouth daily.     clopidogrel (PLAVIX) 75 MG tablet Take 1 tablet (75 mg total) by mouth daily.   hydrALAZINE (APRESOLINE) 25 MG tablet Take 25 mg by mouth daily.   latanoprost (XALATAN) 0.005 % ophthalmic solution Place 1 drop into both eyes at bedtime.   levothyroxine (SYNTHROID, LEVOTHROID) 75 MCG tablet Take 75 mcg by mouth every morning.    losartan (COZAAR) 100 MG tablet Take 100 mg by mouth at bedtime.    pravastatin (PRAVACHOL) 20 MG tablet Take 1 tablet (20 mg total) by mouth daily at 6 PM.    Allergies:   Ciprocinonide [fluocinolone], Codeine sulfate, Penicillins, and Sulfonamide derivatives   Social History   Tobacco Use   Smoking status: Never   Smokeless tobacco: Never  Vaping Use   Vaping Use: Never used  Substance Use Topics   Alcohol use: No   Drug use: No    Family Hx:  The patient's family history includes Arthritis in her sister and son; Cancer in her cousin; Diabetes in her mother and son; Rheum arthritis in her mother. There is no history of Stroke or Seizures.  ROS   EKGs/Labs/Other Test Reviewed:    EKG:  EKG is not ordered today.  The ekg ordered today demonstrates n/a  Recent Labs: 05/05/2022: ALT 13 05/06/2022: BUN 17; Creatinine, Ser 0.86; Hemoglobin 11.5; Platelets 260; Potassium 3.7; Sodium 134; TSH 2.701   Recent Lipid Panel Recent Labs    05/06/22 0342  CHOL 139  TRIG 52  HDL 46  VLDL 10  LDLCALC 83     Risk Assessment/Calculations/Metrics:         HYPERTENSION CONTROL Vitals:   05/14/22 0937 05/14/22 1038  BP: (!) 150/62 (!) 150/62     The patient's blood pressure is elevated above target today.  In order to address the patient's elevated BP: Blood pressure will be monitored at home to determine if medication changes need to be made.       Physical Exam:    VS:  BP (!) 150/62   Pulse 72   Ht '5\' 3"'$  (1.6 m)   Wt 116 lb 12.8 oz (53 kg)   SpO2 97%   BMI 20.69 kg/m     Wt Readings from Last 3 Encounters:  05/14/22 116 lb 12.8 oz (53 kg)  05/05/22 115 lb (52.2 kg)  04/14/22 113 lb 9.6 oz (51.5 kg)    Constitutional:      Appearance: Healthy appearance. Not in distress.  Neck:     Vascular: No carotid bruit. JVD normal.  Pulmonary:     Effort: Pulmonary effort is normal.     Breath sounds: No wheezing. No rales.  Cardiovascular:     Normal rate. Regular rhythm. Normal S1. Normal S2.      Murmurs: There is no murmur.  Edema:    Peripheral edema absent.  Abdominal:     Palpations: Abdomen is soft.  Skin:    General: Skin is warm and dry.  Neurological:     Mental Status: Alert and oriented to person, place and time.         ASSESSMENT & PLAN:   TIA (transient ischemic attack) She was recently admitted for probable TIA.  She has had 1 other episode since discharge from the hospital.  MRI did not demonstrate any acute stroke.  She did not have any severe stenosis noted on head neck CTA.  There was no mention of atrial fibrillation on telemetry.  Her EKG demonstrated normal sinus rhythm.  TSH was normal.  Echocardiogram demonstrated normal LV function and no cardiac source of embolus. 30-day event monitor Follow-up 6 to 8 weeks If monitor negative and she continues to have spells, consider ILR Follow-up with neurology as planned  Essential hypertension I did not repeat her blood pressure today.  Blood pressure elevated.  She is getting similar readings at home.  She has also had some low blood pressures.  She is sensitive to multiple medications.  We will try to keep her blood pressure at least below 160.   I have advised her to take hydralazine 25 mg extra if systolic blood pressures above 160.  Continue to monitor for now.  Hypoproteinemia ([place]) Total protein on labs in the hospital was slightly low.  Albumin is low normal.  I have recommended that she try to increase calories in her diet to help with this.  I have also recommended Ensure or  Boost.             Dispo:  Return in about 8 weeks (around 07/09/2022) for Follow up after testing with Dr. Angelena Form, or Richardson Dopp, PA-C.   Medication Adjustments/Labs and Tests Ordered: Current medicines are reviewed at length with the patient today.  Concerns regarding medicines are outlined above.  Tests Ordered: Orders Placed This Encounter  Procedures   Cardiac event monitor   Medication Changes: No orders of the defined types were placed in this encounter.  Signed, Richardson Dopp, PA-C  05/14/2022 10:48 AM    [facility] [place], [place], [place]  [address] Phone: [phone]; Fax: [phone]

## 2022-05-21 ENCOUNTER — Ambulatory Visit: Payer: PPO | Attending: Physician Assistant

## 2022-05-21 DIAGNOSIS — R001 Bradycardia, unspecified: Secondary | ICD-10-CM | POA: Diagnosis not present

## 2022-05-21 DIAGNOSIS — G459 Transient cerebral ischemic attack, unspecified: Secondary | ICD-10-CM | POA: Diagnosis not present

## 2022-05-22 ENCOUNTER — Telehealth: Payer: Self-pay | Admitting: Cardiovascular Disease

## 2022-05-22 ENCOUNTER — Telehealth: Payer: Self-pay

## 2022-05-22 NOTE — Telephone Encounter (Signed)
Daughter returned call regarding monitor alert. She states patient feels fine and has not had any symptoms today.

## 2022-05-22 NOTE — Telephone Encounter (Signed)
Caller is reporting critical results.

## 2022-05-22 NOTE — Telephone Encounter (Signed)
   Cardiac Monitor Alert  Date of alert:  05/22/2022   Patient Name: Cheryl Yoder  DOB: 1928/02/14  MRN: [id number]   [facility] Cardiologist: Lauree Chandler, MD  [facility] EP:  None    Monitor Information: Cardiac Event Monitor [[employer]]  Reason:  TIA Ordering provider:  Richardson Dopp, PA   Alert Pause(s) - Longest:  4.4 seconds This is the 1st alert for this rhythm.   Next Cardiology Appointment   Date:  07/04/22  Provider:  Richardson Dopp, PA  The patient could NOT be reached by telephone today.  Left VM with daughter when unable to leave message for patient. Arrhythmia, symptoms and history reviewed with Dr. Quentin Ore (DOD).   Plan:  No orders received, will continue to monitor   Tor Netters, RN  05/22/2022 3:41 PM

## 2022-05-22 NOTE — Telephone Encounter (Signed)
Critical EKG report. Auto detected 4.4 sec pause today at 7:22 am CST. Underlying SR 61.      Cardiac Monitor Alert  Date of alert:  05/22/2022   Patient Name: Cheryl Yoder  DOB: [birth year]  MRN: [id number]   [facility] Cardiologist: Lauree Chandler, MD  [facility] EP:  None    Monitor Information: Cardiac Event Monitor [[employer]]  Reason:  TIA Ordering provider:  Richardson Dopp, PA-C   Alert Pause(s) - Longest:  4.4 seconds This is the 1st alert for this rhythm.   Next Cardiology Appointment   Date:  07/04/22  Provider:  Richardson Dopp, PA-C  The patient could NOT be reached by telephone today.  No answer, VM is full, unable to leave a message .  Called Emergency Contact Jyl Heinz, left a message letting her know that we would like the patient to call office. Arrhythmia, symptoms and history reviewed with   Plan:    Other:   Rodman Key, RN  05/22/2022 9:18 AM

## 2022-05-23 ENCOUNTER — Telehealth: Payer: Self-pay | Admitting: *Deleted

## 2022-05-23 NOTE — Telephone Encounter (Signed)
   Cardiac Monitor Alert  Date of alert:  05/23/2022   Patient Name: Cheryl Yoder  DOB: 01/10/1928  MRN: [id number]   [facility] Cardiologist: Lauree Chandler, MD  [facility] EP:  None    Monitor Information: Cardiac Event Monitor [[employer]]  Reason:  TIA/Bradycardia Ordering provider:  [facility]   Alert Pause(s) - Longest:  3.3 seconds This is the 2nd alert for this rhythm.   Next Cardiology Appointment   Date:  07/04/22  Provider:  Kathlen Mody  The patient was contacted today.  She is asymptomatic. Arrhythmia, symptoms and history reviewed. Discussed with DOD, Dr. Acie Fredrickson, no orders received   Plan:  continue with monitor for now   Other:   [name], RN  05/23/2022 12:11 PM

## 2022-05-26 ENCOUNTER — Telehealth: Payer: Self-pay | Admitting: Internal Medicine

## 2022-05-26 DIAGNOSIS — I455 Other specified heart block: Secondary | ICD-10-CM

## 2022-05-26 NOTE — Telephone Encounter (Addendum)
   Cardiac Monitor Alert  Date of alert:  05/26/2022   Patient Name: Cheryl Yoder  DOB: 1927-09-17  MRN: [id number]   [facility] Cardiologist: Lauree Chandler, MD  [facility] EP:  None    Monitor Information: Cardiac Event Monitor [[employer]]  Reason:  TIA, Loletha Grayer  Ordering provider:  Estevan Ryder { Alert Pause(s) - Longest:  11.1 seconds 11/5 9:12 pm 4.1 sec pause; 11/6 2:45 am 10 sec pause; 11/6 3:07 am 11.1 sec pause. This is the 5th alert for this rhythm.   Next Cardiology Appointment   Date:  07/04/22  Provider:  Kathleen Argue  The patient could NOT be reached by telephone today.  Unable to leave voice mail, box is full.  Arrhythmia, symptoms and history reviewed with C. McAlhany.   Plan:  Request an urgent EP referral this week. Appt made 11/8 at 1:45 pm. Will continue to try and reach the patient.    I was able to get the patient's daughter on the phone Jyl Heinz. Gave appt information. Thankful for the call and she will drive her mother on 59/4.    Darrell Jewel, RN  05/26/2022 8:22 AM

## 2022-05-26 NOTE — Addendum Note (Signed)
Addended by: Darrell Jewel on: 05/26/2022 09:12 AM   Modules accepted: Orders

## 2022-05-26 NOTE — Telephone Encounter (Signed)
[employer] stating that patient had a pause of 10 seconds around 2 AM. After she returned back to NSR with 1st degree AV block. Will attempt to contact patient again in AM

## 2022-05-28 ENCOUNTER — Encounter: Payer: Self-pay | Admitting: Cardiology

## 2022-05-28 ENCOUNTER — Ambulatory Visit: Payer: PPO | Attending: Cardiology | Admitting: Cardiology

## 2022-05-28 VITALS — BP 162/70 | HR 71 | Ht 63.0 in | Wt 117.8 lb

## 2022-05-28 DIAGNOSIS — I10 Essential (primary) hypertension: Secondary | ICD-10-CM | POA: Diagnosis not present

## 2022-05-28 DIAGNOSIS — I495 Sick sinus syndrome: Secondary | ICD-10-CM | POA: Diagnosis not present

## 2022-05-28 NOTE — Progress Notes (Signed)
Electrophysiology Office Note   Date:  05/28/2022   ID:  Cheryl Yoder, DOB 27-Jun-1928, MRN [id number]  PCP:  Michael Boston, MD  Cardiologist:  Angelena Form Primary Electrophysiologist:  Fayola Meckes Meredith Leeds, MD    Chief Complaint: dizziness   History of Present Illness: Cheryl Yoder is a 88 y.o. female who is being seen today for the evaluation of sinus node dysfunction at the request of Jacalyn Lefevre, Jesse Sans, MD. Presenting today for electrophysiology evaluation.  For years, patient has been having episodes of dizziness, followed by SOB, weakness, and a facial flushing sensation. She has syncopized in the past, but does not normally syncopize during episodes. She has these episodes a couple times a week and usually last a couple seconds, but can sometimes last longer.   She recently went to hospital for one of the worst episodes she has ever had, and was worried she was having a stroke. Stroke workup was negative. She followed up with gen cards in clinic who recommended she wear an ambulatory monitor. Monitor has alerted 3 times as below. All events were while patient was in bed asleep. She has not had one of her episodes since wearing the monitor.   Today, she denies symptoms of palpitations, chest pain, shortness of breath, orthopnea, PND, lower extremity edema, claudication, dizziness, presyncope, syncope, bleeding, or neurologic sequela. The patient is tolerating medications without difficulties.    Past Medical History:  Diagnosis Date   Cataract    Chest pain    GIST (gastrointestinal stromal tumor), malignant ([place]) 2008   Glaucoma    Hypertension    Hypothyroid    Kidney disease    Sensorineural hearing loss    Past Surgical History:  Procedure Laterality Date   ABDOMINAL HYSTERECTOMY     ESOPHAGOGASTRODUODENOSCOPY  08/14/2011   Procedure: ESOPHAGOGASTRODUODENOSCOPY (EGD);  Surgeon: Owens Loffler, MD;  Location: [facility];  Service: Endoscopy;  Laterality: N/A;   EUS   08/14/2011   Procedure: UPPER ENDOSCOPIC ULTRASOUND (EUS) LINEAR;  Surgeon: Owens Loffler, MD;  Location: [facility];  Service: Endoscopy;  Laterality: N/A;   STOMACH SURGERY  2006   gist tumor removed   TOTAL HIP ARTHROPLASTY Right 12/02/2012   Procedure: RIGHT TOTAL HIP ARTHROPLASTY;  Surgeon: Tobi Bastos, MD;  Location: [facility];  Service: Orthopedics;  Laterality: Right;     Current Outpatient Medications  Medication Sig Dispense Refill   acetaminophen (TYLENOL) 500 MG tablet Take 1,000 mg by mouth every 6 (six) hours as needed for mild pain.     calcium-vitamin D (OSCAL) 250-125 MG-UNIT per tablet Take 1 tablet by mouth daily.       clopidogrel (PLAVIX) 75 MG tablet Take 1 tablet (75 mg total) by mouth daily. 30 tablet 2   hydrALAZINE (APRESOLINE) 25 MG tablet Take 25 mg by mouth daily.     latanoprost (XALATAN) 0.005 % ophthalmic solution Place 1 drop into both eyes at bedtime.     levothyroxine (SYNTHROID, LEVOTHROID) 75 MCG tablet Take 75 mcg by mouth. Monday - Saturday first thing in the morning before breakfast     losartan (COZAAR) 100 MG tablet Take 100 mg by mouth at bedtime.      pravastatin (PRAVACHOL) 20 MG tablet Take 1 tablet (20 mg total) by mouth daily at 6 PM. 30 tablet 2   No current facility-administered medications for this visit.    Allergies:   Ciprocinonide [fluocinolone], Codeine sulfate, Penicillins, and Sulfonamide derivatives   Social History:  The patient  reports that she has never smoked. She has never used smokeless tobacco. She reports that she does not drink alcohol and does not use drugs.   Family History:  The patient's family history includes Arthritis in her sister and son; Cancer in her cousin; Diabetes in her mother and son; Rheum arthritis in her mother.    ROS:  Please see the history of present illness.   Otherwise, review of systems is positive for none.   All other systems are reviewed and negative.    PHYSICAL EXAM: VS:  BP (!)  162/70 (BP Location: Right Leg)   Pulse 71   Ht '5\' 3"'$  (1.6 m)   Wt 117 lb 12.8 oz (53.4 kg)   SpO2 98%   BMI 20.87 kg/m  , BMI Body mass index is 20.87 kg/m. GEN: Well nourished, thin, well developed, in no acute distress  HEENT: normal  Neck: no JVD, carotid bruits, or masses Cardiac: RRR; no murmurs, rubs, or gallops,no edema  Respiratory:  clear to auscultation bilaterally, normal work of breathing GI: soft, nontender, nondistended, + BS MS: no deformity or atrophy  Skin: warm and dry,  Neuro:  Strength and sensation are intact Psych: euthymic mood, full affect  EKG:  EKG is not ordered today.   Recent Labs: 05/05/2022: ALT 13 05/06/2022: BUN 17; Creatinine, Ser 0.86; Hemoglobin 11.5; Platelets 260; Potassium 3.7; Sodium 134; TSH 2.701    Lipid Panel     Component Value Date/Time   CHOL 139 05/06/2022 0342   TRIG 52 05/06/2022 0342   HDL 46 05/06/2022 0342   CHOLHDL 3.0 05/06/2022 0342   VLDL 10 05/06/2022 0342   LDLCALC 83 05/06/2022 0342     Wt Readings from Last 3 Encounters:  05/28/22 117 lb 12.8 oz (53.4 kg)  05/14/22 116 lb 12.8 oz (53 kg)  05/05/22 115 lb (52.2 kg)      Other studies Reviewed: Additional studies/ records that were reviewed today include: [employer] ambulatory monitor   Review of the above records today demonstrates: sinus  node dysfunction          ASSESSMENT AND PLAN:  1.  Sinus Node Dysfunction No reversible causes Patient has concerning story for pauses while awake, however not able to correlate symptoms with episode on monitor as she is asleep during pause episodes.  She Carlye Panameno continue to wear monitor, noting whether she has episodes while wearing. Shelisa Fern follow-up formal results to determine whether PPM is appropriate  2. HTN - uncontrolled currently, has been ongoing issue with many medication changes and adjustments in the past Recommended she take BP more regularly at home Defer medication adjustments at this  time.   Current medicines are reviewed at length with the patient today.   The patient does not have concerns regarding her medicines.  The following changes were made today:  none  Labs/ tests ordered today include: none No orders of the defined types were placed in this encounter.    Disposition:   FU with Aldene Hendon after ambulatory monitor results   Signed, Besnik Febus Meredith Leeds, MD  05/28/2022 3:08 PM     [address] [place] [id number] [phone] (office) [phone] (fax)  I have seen and examined this patient with Mamie Levers.  Agree with above, note added to reflect my findings.  She presents today for therapy for episodes of dizziness, shortness of breath, weakness, facial flushing.  She has a cardiac monitor in place.  Monitoring shows episodic pauses  of up to 11 seconds.  She has had 3 pauses in the last 3 days.  During each episode, she states that she was asleep.  While wearing the monitor thus far, she has not had any further episodes of the above symptoms.  She has felt well.  She states that the episodes last for seconds to minutes at a time.  GEN: Well nourished, well developed, in no acute distress  HEENT: normal  Neck: no JVD, carotid bruits, or masses Cardiac: RRR; no murmurs, rubs, or gallops,no edema  Respiratory:  clear to auscultation bilaterally, normal work of breathing GI: soft, nontender, nondistended, + BS MS: no deformity or atrophy  Skin: warm and dry Neuro:  Strength and sensation are intact Psych: euthymic mood, full affect   Sinus node dysfunction: She has had pauses, but fortunately each pause is nocturnal.  She has not had any symptoms of dizziness.  Due to that, we Amra Shukla have her continue to wear her monitor for now to see if she has any further episodes of dizziness that we can correlate with arrhythmias.  As her pauses are nocturnal, we Venetia Prewitt hold off on pacemaker implant for now.  If she does  have daytime pauses, pacemaker implant would potentially be indicated. Hypertension: Currently uncontrolled.  She has been having issues with many medications.  Plan per primary physician and primary cardiology.  Kenyan Karnes M. Michael Ventresca MD 05/28/2022 3:08 PM

## 2022-05-29 ENCOUNTER — Encounter: Payer: Self-pay | Admitting: Cardiovascular Disease

## 2022-05-29 ENCOUNTER — Telehealth: Payer: Self-pay

## 2022-05-29 ENCOUNTER — Telehealth: Payer: Self-pay | Admitting: Physician Assistant

## 2022-05-29 NOTE — Telephone Encounter (Signed)
   Cardiac Monitor Alert  Date of alert:  05/29/2022   Patient Name: Cheryl Yoder  DOB: 01-09-1928  MRN: [id number]   [facility] Cardiologist: Lauree Chandler, MD  [facility] EP:  None    Monitor Information: Cardiac Event Monitor [[employer]]  Reason:  TIA Ordering provider:  [place]   Alert Pause(s) - Longest:  11 seconds This is the 3rd alert for this rhythm.   Next Cardiology Appointment   Date:  pt was seen yesterday in consult with Dr. Curt Bears about this matter.  Called pt this morning, verified sleeping at 6:02 this morning when pause occurred.  The patient was contacted today.  She is asymptomatic. Arrhythmia, symptoms and history reviewed with Dr. Ali Lowe, no orders received/needed. Plan:  await final monitor results/continue with monitoring   Other:   [name], RN  05/29/2022 8:30 AM

## 2022-05-29 NOTE — Telephone Encounter (Signed)
Pt's daughter is returning call. Transferred to Vergia Alcon, RN.

## 2022-05-29 NOTE — Telephone Encounter (Signed)
Left message with patient's daughter to return call for cardiac monitor alert.

## 2022-05-29 NOTE — Telephone Encounter (Signed)
Paged by [facility] [place] for 11 sec pause @ 5:08 cm. Underlying rhythm sinus. Reviewed Dr. Curt Bears note.

## 2022-05-29 NOTE — Telephone Encounter (Signed)
Spoke with patient's daughter and she states patient did not experience any symptoms this morning related to monitor alert. This is also documented in previous note cardiac monitor alert.

## 2022-05-31 ENCOUNTER — Encounter: Payer: Self-pay | Admitting: Cardiology

## 2022-06-02 NOTE — Telephone Encounter (Signed)
   Cardiac Monitor Alert  Date of alert:  06/02/2022   Patient Name: Cheryl Yoder  DOB: 1928-07-07  MRN: [id number]   [facility] Cardiologist: Lauree Chandler, MD  [facility] EP:  Dr. Curt Bears  Monitor Information: Cardiac Event Monitor [[employer]]  Reason:  Dizziness and near syncope Ordering provider:  Dr. Angelena Form    Alert Pause(s) - Longest:  4.6  seconds This is the 2nd alert for this rhythm.   Next Cardiology Appointment   Date:  07/04/2022  Provider:  Richardson Dopp, PA  The patient was contacted today.  She is symptomatic.  She reports the following symptoms: light headed, rapid breathing and heart racing. Resolved after about 20 minutes. Denies any additional events.  Arrhythmia, symptoms and history reviewed with Dr. Angelena Form.   Plan:  Continue to monitor   Tor Netters, RN  06/02/2022 11:15 AM

## 2022-06-09 ENCOUNTER — Telehealth: Payer: Self-pay | Admitting: *Deleted

## 2022-06-09 ENCOUNTER — Telehealth: Payer: Self-pay | Admitting: Cardiovascular Disease

## 2022-06-09 NOTE — Telephone Encounter (Signed)
   Cardiac Monitor Alert  Date of alert:  06/09/2022   Patient Name: Cheryl Yoder  DOB: 15-Mar-1928  MRN: [id number]   [facility] Cardiologist: Lauree Chandler, MD  [facility] EP:  None    Monitor Information: Cardiac Event Monitor [[employer]]  Reason:  TIA, Bradycardia Ordering provider:  Lauree Chandler, MD { Alert Pause(s) - Longest:  3 and 4 seconds This is the 4th alert for this rhythm.   Next Cardiology Appointment   Date:  12/15/2023n at 10:30AM  Provider:  Richardson Dopp, PA-C  The patient was contacted today.  She is symptomatic.  She reports the following symptoms: patient reports feeling "swimmy headed"/lightheaded. She states she was standing in the kitchen, not performing any strenuous activity. She states she sat down and symptoms resolved in about 30 minutes. No other complaints since this time of monitor alert . Arrhythmia, symptoms and history reviewed with DOD Dr. Rudean Haskell.   Plan:  Follow-up with EP as planned after wearing heart monitor, continue to monitor at this time.   Molli Barrows, RN  06/09/2022 3:45 PM

## 2022-06-09 NOTE — Telephone Encounter (Signed)
Called to pt see how she is feeling and if she had any complaints of dizziness/lightheadedness etc this AM around 8:45 am or 9:06 am.  Pt denies any s/s from tis AM "that she remembers.  Faxed monitor strips demonstrated (2) pauses one was auto triggered 4.2 sec pause at 8:42 am and 4.3 sec pause at 9:06 am.  Advised pt to continue to monitor for any s/s and notify the office if any complaints/needs.  She states understanding.    Reviewed with Dr Gasper Sells.   No new orders at this time.

## 2022-06-18 ENCOUNTER — Encounter: Payer: Self-pay | Admitting: Neurology

## 2022-06-18 ENCOUNTER — Ambulatory Visit: Payer: PPO | Admitting: Neurology

## 2022-06-18 VITALS — BP 196/68 | Ht 63.0 in | Wt 119.0 lb

## 2022-06-18 DIAGNOSIS — R413 Other amnesia: Secondary | ICD-10-CM | POA: Diagnosis not present

## 2022-06-18 DIAGNOSIS — G459 Transient cerebral ischemic attack, unspecified: Secondary | ICD-10-CM

## 2022-06-18 NOTE — Progress Notes (Signed)
[facility] [address] [place]. [place] [address] [phone]       OFFICE CONSULT NOTE  Cheryl Yoder Date of Birth:  [birth year] Medical Record Number:  [id number]   Referring MD: Bonnielee Haff  Reason for Referral: TIA  HPI: Cheryl Yoder is a pleasant 88 year old Caucasian lady seen today for initial office consultation visit.  She is accompanied by her daughter.  History is obtained from them and review of electronic medical records.  I have personally reviewed pertinent available imaging films in PACS.  She has past medical history of hypertension, hyperlipidemia, gastric cancer, glaucoma presented on 05/05/2022 with episode of sudden onset of not feeling right and weak all over and speech being very low and slow.  When EMS arrived patient was unable to hold either of her arms and generalized weakness.  She stated she just does not feel right and strange feeling in her head.  She states she is fully aware of her surroundings and remembers all the events clearly.  MRI scan of the brain was negative for acute stroke.  CT angiogram shows mild to moderate of the right ACA, right MCA and bilateral vertebral artery origin.  Echocardiogram showed ejection fraction of 55 to 60%.  EEG was normal.  LDL cholesterol was 83 mg percent and hemoglobin A1c was 5.3.  Patient was started on aspirin and Plavix for 3 weeks.  Followed by Plavix alone and on Pravachol 20 mg.  She is tolerating both medications well without bruising or other side effects.  He is currently wearing a heart monitor from few episodes of heart block with no atrial fibrillation yet.  Patient inquiry.  She may have had somewhat similar episodes in the past but they are not as severe and did not last as long.  She did not get a work-up for those episodes.  Denies any prior history of strokes, TIAs or seizures or significant neurological problems.  On inquiry she admits to mild short-term memory difficulties and  trouble remembering names but this is not progressive or bothersome.  He is quite independent with her daily living.  Her daughter lives nearby and provide support but she lives alone by herself  ROS:   14 system review of systems is positive for not feeling well, change feeling in the head, near passing out,, speech difficulty, weight loss and all other systems negative  PMH:  Past Medical History:  Diagnosis Date   Cataract    Chest pain    GIST (gastrointestinal stromal tumor), malignant ([place]) 2008   Glaucoma    Hypertension    Hypothyroid    Kidney disease    Sensorineural hearing loss     Social History:  Social History   Socioeconomic History   Marital status: Widowed    Spouse name: Not on file   Number of children: 2   Years of education: Not on file   Highest education level: Not on file  Occupational History   Occupation: Retired    Fish farm manager: RETIRED  Tobacco Use   Smoking status: Never   Smokeless tobacco: Never  Vaping Use   Vaping Use: Never used  Substance and Sexual Activity   Alcohol use: No   Drug use: No   Sexual activity: Not on file  Other Topics Concern   Not on file  Social History Narrative   Not on file   Social Determinants of Health   Financial Resource Strain: Not on file  Food Insecurity: No Food Insecurity (  05/06/2022)   Hunger Vital Sign    Worried About Running Out of Food in the Last Year: Never true    [place] in the Last Year: Never true  Transportation Needs: No Transportation Needs (05/06/2022)   PRAPARE - Hydrologist (Medical): No    Lack of Transportation (Non-Medical): No  Physical Activity: Not on file  Stress: Not on file  Social Connections: Not on file  Intimate Partner Violence: Not At Risk (05/06/2022)   Humiliation, Afraid, Rape, and Kick questionnaire    Fear of Current or Ex-Partner: No    Emotionally Abused: No    Physically Abused: No    Sexually Abused: No     Medications:   Current Outpatient Medications on File Prior to Visit  Medication Sig Dispense Refill   acetaminophen (TYLENOL) 500 MG tablet Take 1,000 mg by mouth every 6 (six) hours as needed for mild pain.     calcium-vitamin D (OSCAL) 250-125 MG-UNIT per tablet Take 1 tablet by mouth daily.       clopidogrel (PLAVIX) 75 MG tablet Take 1 tablet (75 mg total) by mouth daily. 30 tablet 2   hydrALAZINE (APRESOLINE) 25 MG tablet Take 25 mg by mouth daily.     latanoprost (XALATAN) 0.005 % ophthalmic solution Place 1 drop into both eyes at bedtime.     levothyroxine (SYNTHROID, LEVOTHROID) 75 MCG tablet Take 75 mcg by mouth. Monday - Saturday first thing in the morning before breakfast     losartan (COZAAR) 100 MG tablet Take 100 mg by mouth at bedtime.      pravastatin (PRAVACHOL) 20 MG tablet Take 1 tablet (20 mg total) by mouth daily at 6 PM. 30 tablet 2   No current facility-administered medications on file prior to visit.    Allergies:   Allergies  Allergen Reactions   Ciprocinonide [Fluocinolone] Nausea And Vomiting    Causes N/V and diarrhea   Codeine Sulfate Nausea And Vomiting   Penicillins Other (See Comments)    unknown   Sulfonamide Derivatives Rash    Physical Exam General: Pleasant frail elderly Caucasian lady, seated, in no evident distress Head: head normocephalic and atraumatic.   Neck: supple with no carotid or supraclavicular bruits Cardiovascular: regular rate and rhythm, no murmurs Musculoskeletal: no deformity Skin:  no rash/petichiae Vascular:  Normal pulses all extremities  Neurologic Exam Mental Status: Awake and fully alert. Oriented to place and time. Recent and remote memory intact. Attention span, concentration and fund of knowledge appropriate. Mood and affect appropriate.  Diminished recall 0/3.  Able to name 10 animals which can walk on 4 legs.  Clock drawing 4/4. Cranial Nerves: Fundoscopic exam reveals sharp disc margins. Pupils equal,  briskly reactive to light. Extraocular movements full without nystagmus. Visual fields full to confrontation. Hearing mildly diminished bilaterally .  Face is symmetric without weakness.  Tongue midline. Motor: Normal bulk and tone. Normal strength in all tested extremity muscles. Sensory.: intact to touch , pinprick , position and vibratory sensation.  Coordination: Rapid alternating movements normal in all extremities. Finger-to-nose and heel-to-shin performed accurately bilaterally. Gait and Station: Arises from chair without difficulty. Stance is normal. Gait demonstrates normal stride length and balance . Able to heel, toe and tandem walk with great difficulty.  Reflexes: 1+ and symmetric. Toes downgoing.   NIHSS  0 Modified [place]  1   ASSESSMENT: 88 year old Caucasian lady with transient episode of strange feeling in the head, nearly passing out of  unclear etiology possibly near syncope rather than TIA.  She also has mild age-related cognitive impairment     PLAN: I had a long discussion with the patient and her daughter regarding her dialysis episode of feeling clammy and being of unclear etiology possibly presyncopal episode versus TIA.  I recommend she continue Plavix for stroke prevention and maintain aggressive risk factor modification.  She also has mild age-appropriate cognitive impairment.  I recommend she increase participation in cognitively challenging activities like solving crossword puzzles, playing bridge, word search and sudoku.  We also discussed memory compensation strategies.  She will continue ongoing cardiac monitoring and may need Follow-up with cardiology if significant heart block is confirmed Return for follow-up with me in the future only as needed.  Greater than 50% time during this 45-minute consultation visit was spent on counseling and coordination of care about her episode of not feeling well, speech difficulty as well as mild cognitive impairment and answering  questions Antony Contras, MD  Note: This document was prepared with digital dictation and possible smart phrase technology. Any transcriptional errors that result from this process are unintentional.

## 2022-06-18 NOTE — Patient Instructions (Signed)
I had a long discussion with the patient and her daughter regarding her dialysis episode of feeling clammy and being of unclear etiology possibly presyncopal episode versus TIA.  I recommend she continue Plavix for stroke prevention and maintain aggressive risk factor modification.  She also has mild age-appropriate cognitive impairment.  I recommend she increase participation in cognitively challenging activities like solving crossword puzzles, playing bridge, word search and sudoku.  We also discussed memory compensation strategies.  She will continue ongoing cardiac monitoring and may need Follow-up with cardiology if significant heart block is confirmed Return for follow-up with me in the future only as needed.  Memory Compensation Strategies  Use "WARM" strategy.  W= write it down  A= associate it  R= repeat it  M= make a mental note  2.   You can keep a Social worker.  Use a 3-ring notebook with sections for the following: calendar, important names and phone numbers,  medications, doctors' names/phone numbers, lists/reminders, and a section to journal what you did  each day.   3.    Use a calendar to write appointments down.  4.    Write yourself a schedule for the day.  This can be placed on the calendar or in a separate section of the Memory Notebook.  Keeping a  regular schedule can help memory.  5.    Use medication organizer with sections for each day or morning/evening pills.  You may need help loading it  6.    Keep a basket, or pegboard by the door.  Place items that you need to take out with you in the basket or on the pegboard.  You may also want to  include a message board for reminders.  7.    Use sticky notes.  Place sticky notes with reminders in a place where the task is performed.  For example: " turn off the  stove" placed by the stove, "lock the door" placed on the door at eye level, " take your medications" on  the bathroom mirror or by the place where you normally  take your medications.  8.    Use alarms/timers.  Use while cooking to remind yourself to check on food or as a reminder to take your medicine, or as a  reminder to make a call, or as a reminder to perform another task, etc.

## 2022-06-30 ENCOUNTER — Inpatient Hospital Stay: Payer: PPO | Admitting: Neurology

## 2022-07-03 NOTE — Progress Notes (Addendum)
Cardiology Office Note:    Date:  07/04/2022   ID:  Cheryl Yoder, DOB 01/04/1928, MRN [id number]  PCP:  Michael Boston, MD  [facility] Cardiologist:  Lauree Chandler, MD    Referring MD: Michael Boston, MD   Chief Complaint:  F/u for dizziness    Patient Profile: Sick sinus syndrome Monitor [phone]: several pauses; longest 11 sec (during sleep) Hypertension  Intol of HCTZ, Coreg, Hydralazine - ? BP [place] 12/2014: no RA stenosis  TTE 05/06/22: No apical thrombus, EF 55-60, no RWMA, G1 DD, low normal RVSF, normal PASP, trivial MR, RAP 8, RVSP 25.8  Chronic kidney disease  Hypothyroidism  ?Hx of TIA  GIST Glaucoma  Aortic atherosclerosis     History of Present Illness:   Cheryl Yoder is a 88 y.o. female with the above problem list.  She was last seen 05/14/22 after an admission with a suspected TIA. She noted spells of mental status changes. An event monitor was obtained. This demonstrated several long pauses (longest 11 sec). She did have an evaluation with EP (Dr. Curt Bears). The pauses were during sleep. She returns for f/u.  She is here with her daughter.  She had 1 episode of dizziness that occurred while she was wearing the monitor.  There is an alert in her chart from that date (05/31/2022).  She had a 4.6-second pause recorded at that time.  She has not had any further episodes of dizziness.  She has not had syncope, near syncope, exercise intolerance.  She has not had chest pain or shortness of breath.  EKG:  not done    Reviewed and updated this encounter:  Tobacco  Allergies  Meds  Problems  Med Hx  Surg Hx  Fam Hx     ROS  Labs/Other Test Reviewed:   Recent Labs: 05/05/2022: ALT 13 05/06/2022: BUN 17; Creatinine, Ser 0.86; Hemoglobin 11.5; Platelets 260; Potassium 3.7; Sodium 134; TSH 2.701  Recent Lipid Panel Recent Labs    05/06/22 0342  CHOL 139  TRIG 52  HDL 46  VLDL 10  LDLCALC 83    Risk Assessment/Calculations/Metrics:          HYPERTENSION CONTROL Vitals:   07/04/22 1037 07/04/22 1102  BP: (!) 160/72 (!) 160/90    The patient's blood pressure is elevated above target today.  In order to address the patient's elevated BP: Blood pressure will be monitored at home to determine if medication changes need to be made.      Physical Exam:   VS:  BP (!) 160/90   Pulse 78   Ht '5\' 3"'$  (1.6 m)   Wt 116 lb 3.2 oz (52.7 kg)   SpO2 98%   BMI 20.58 kg/m    Wt Readings from Last 3 Encounters:  07/04/22 116 lb 3.2 oz (52.7 kg)  06/18/22 119 lb (54 kg)  05/28/22 117 lb 12.8 oz (53.4 kg)    Constitutional:      Appearance: Healthy appearance. Not in distress.  Pulmonary:     Breath sounds: Normal breath sounds. No wheezing. No rales.  Cardiovascular:     Normal rate. Regular rhythm. Normal S1. Normal S2.      Murmurs: There is no murmur.  Edema:    Peripheral edema absent.          ASSESSMENT & PLAN:   Sick sinus syndrome ([place]) She had several pauses. The longest was 11 sec. She had one episodes with symptoms on 05/31/22. Otherwise,  all of her pauses were during sleep. We discussed indications for pacemaker. At this point, she is not having significant symptoms. I will have her f/u with Dr. Curt Bears in 2 mos. She knows to call us if she develops symptoms of syncope, near syncope, exercise intolerance, lethargy.   Essential hypertension She has not had medications yet today. She cannot tolerate extra Hydralazine due to dizziness. She only takes it once daily. See prior notes. Our goal is to keep her systolic BP < 677. Continue Losartan 100 mg once daily.            Dispo:  Return in about 2 months (around 09/04/2022) for Routine Follow Up w Dr. Curt Bears.  Medication Adjustments/Labs and Tests Ordered: Current medicines are reviewed at length with the patient today.  Concerns regarding medicines are outlined above.  Tests Ordered: No orders of the defined types were placed in this  encounter.  Medication Changes: No orders of the defined types were placed in this encounter.  Signed, Richardson Dopp, PA-C  07/04/2022 11:07 AM    [facility] [place], [place], [place]  [address] Phone: [phone]; Fax: [phone]

## 2022-07-04 ENCOUNTER — Ambulatory Visit: Payer: PPO | Attending: Physician Assistant | Admitting: Physician Assistant

## 2022-07-04 ENCOUNTER — Encounter: Payer: Self-pay | Admitting: Physician Assistant

## 2022-07-04 VITALS — BP 160/90 | HR 78 | Ht 63.0 in | Wt 116.2 lb

## 2022-07-04 DIAGNOSIS — I10 Essential (primary) hypertension: Secondary | ICD-10-CM

## 2022-07-04 DIAGNOSIS — I495 Sick sinus syndrome: Secondary | ICD-10-CM | POA: Diagnosis not present

## 2022-07-04 NOTE — Patient Instructions (Signed)
Medication Instructions:  Your physician recommends that you continue on your current medications as directed. Please refer to the Current Medication list given to you today.  *If you need a refill on your cardiac medications before your next appointment, please call your pharmacy*   Lab Work: None ordered  If you have labs (blood work) drawn today and your tests are completely normal, you will receive your results only by: [place] (if you have MyChart) OR A paper copy in the mail If you have any lab test that is abnormal or we need to change your treatment, we will call you to review the results.   Testing/Procedures: None ordered   Follow-Up: At [facility], you and your health needs are our priority.  As part of our continuing mission to provide you with exceptional heart care, we have created designated Provider Care Teams.  These Care Teams include your primary Cardiologist (physician) and Advanced Practice Providers (APPs -  Physician Assistants and Nurse Practitioners) who all work together to provide you with the care you need, when you need it.  We recommend signing up for the patient portal called "MyChart".  Sign up information is provided on this After Visit Summary.  MyChart is used to connect with patients for Virtual Visits (Telemedicine).  Patients are able to view lab/test results, encounter notes, upcoming appointments, etc.  Non-urgent messages can be sent to your provider as well.   To learn more about what you can do with MyChart, go to [email or web address].    Your next appointment:   2 month(s)  The format for your next appointment:   In Person  Provider:   You may see Dr. Curt Bears or one of the following Advanced Practice Providers on your designated Care Team:   Tommye Standard, [place] Legrand Como "Jonni Sanger" Chalmers Cater, [place]    Other Instructions   Important Information About Sugar

## 2022-07-04 NOTE — Assessment & Plan Note (Signed)
She has not had medications yet today. She cannot tolerate extra Hydralazine due to dizziness. She only takes it once daily. See prior notes. Our goal is to keep her systolic BP < 016. Continue Losartan 100 mg once daily.

## 2022-07-04 NOTE — Assessment & Plan Note (Signed)
She had several pauses. The longest was 11 sec. She had one episodes with symptoms on 05/31/22. Otherwise, all of her pauses were during sleep. We discussed indications for pacemaker. At this point, she is not having significant symptoms. I will have her f/u with Dr. Curt Bears in 2 mos. She knows to call us if she develops symptoms of syncope, near syncope, exercise intolerance, lethargy.

## 2022-07-07 ENCOUNTER — Telehealth: Payer: Self-pay | Admitting: *Deleted

## 2022-07-07 NOTE — Telephone Encounter (Signed)
Pt scheduled to see Dr. Curt Bears Thursday morning to further discuss. Dtr verbalized understanding and agreeable to plan.

## 2022-07-07 NOTE — Telephone Encounter (Signed)
Constance Haw, MD  Liliane Shi, PA-C; Burnell Blanks, MD; [name], RN Will get her into clinic soon to discuss further. Thanks for the update.       Previous Messages    ----- Message ----- From: Sharmon Revere Sent: 07/04/2022  11:08 AM EST To: Constance Haw, MD; *  Will - She had 1 episode of dizziness. She sent a MyChart message on 05/31/22. The alert attached to that note shows a 4.6 sec pause. She has had no other symptoms. I set her up to see you in 2 mos. Let me know if you think we should have you see her sooner. Thanks!

## 2022-07-10 ENCOUNTER — Encounter: Payer: Self-pay | Admitting: Cardiology

## 2022-07-10 ENCOUNTER — Encounter: Payer: Self-pay | Admitting: *Deleted

## 2022-07-10 ENCOUNTER — Ambulatory Visit: Payer: PPO | Attending: Cardiology | Admitting: Cardiology

## 2022-07-10 VITALS — BP 130/80 | HR 66 | Ht 63.0 in | Wt 117.0 lb

## 2022-07-10 DIAGNOSIS — I10 Essential (primary) hypertension: Secondary | ICD-10-CM

## 2022-07-10 DIAGNOSIS — Z01812 Encounter for preprocedural laboratory examination: Secondary | ICD-10-CM | POA: Diagnosis not present

## 2022-07-10 DIAGNOSIS — I495 Sick sinus syndrome: Secondary | ICD-10-CM | POA: Diagnosis not present

## 2022-07-10 NOTE — Patient Instructions (Addendum)
Medication Instructions:  Your physician recommends that you continue on your current medications as directed. Please refer to the Current Medication list given to you today.  Hold Plavix for 2 days PRIOR to the procedure on 08/04/22     * If you need a refill on your cardiac medications before your next appointment, please call your pharmacy. *   Labwork: Pre procedure lab work today: BMET & CBC  * Will notify you of abnormal results, otherwise continue current treatment plan.*   Testing/Procedures: Your physician has recommended that you have a pacemaker inserted. A pacemaker is a small device that is placed under the skin of your chest or abdomen to help control abnormal heart rhythms. This device uses electrical pulses to prompt the heart to beat at a normal rate. Pacemakers are used to treat heart rhythms that are too slow. Wire (leads) are attached to the pacemaker that goes into the chambers of you heart. This is done in the hospital and usually requires and overnight stay. Please see the instruction sheet given to you today  Follow-Up: Your physician recommends that you schedule a wound check appointment 10-14 days, after your procedure on 07/25/22, with the device clinic.  Your physician recommends that you schedule a follow up appointment in 91 days, after your procedure on 07/25/22, with Dr. Curt Bears.   Thank you for choosing [facility]!!   Trinidad Curet, RN [phone]     Pacemaker Implantation, Adult Pacemaker implantation is a procedure to place a pacemaker inside your chest. A pacemaker is a small computer that sends electrical signals to the heart and helps your heart beat normally. A pacemaker also stores information about your heart rhythms. You may need pacemaker implantation if you: Have a slow heartbeat (bradycardia). Faint (syncope). Have shortness of breath (dyspnea) due to heart problems.  The pacemaker attaches to your heart through a wire, called a  lead. Sometimes just one lead is needed. Other times, there will be two leads. There are two types of pacemakers: Transvenous pacemaker. This type is placed under the skin or muscle of your chest. The lead goes through a vein in the chest area to reach the inside of the heart. Epicardial pacemaker. This type is placed under the skin or muscle of your chest or belly. The lead goes through your chest to the outside of the heart.  Tell a health care provider about: Any allergies you have. All medicines you are taking, including vitamins, herbs, eye drops, creams, and over-the-counter medicines. Any problems you or family members have had with anesthetic medicines. Any blood or bone disorders you have. Any surgeries you have had. Any medical conditions you have. Whether you are pregnant or may be pregnant. What are the risks? Generally, this is a safe procedure. However, problems may occur, including: Infection. Bleeding. Failure of the pacemaker or the lead. Collapse of a lung or bleeding into a lung. Blood clot inside a blood vessel with a lead. Damage to the heart. Infection inside the heart (endocarditis). Allergic reactions to medicines.  What happens before the procedure? Staying hydrated Follow instructions from your health care provider about hydration, which may include: Up to 2 hours before the procedure - you may continue to drink clear liquids, such as water, clear fruit juice, black coffee, and plain tea.  Eating and drinking restrictions Follow instructions from your health care provider about eating and drinking, which may include: 8 hours before the procedure - stop eating heavy meals or foods such as  meat, fried foods, or fatty foods. 6 hours before the procedure - stop eating light meals or foods, such as toast or cereal. 6 hours before the procedure - stop drinking milk or drinks that contain milk. 2 hours before the procedure - stop drinking clear  liquids.  Medicines Ask your health care provider about: Changing or stopping your regular medicines. This is especially important if you are taking diabetes medicines or blood thinners. Taking medicines such as aspirin and ibuprofen. These medicines can thin your blood. Do not take these medicines before your procedure if your health care provider instructs you not to. You may be given antibiotic medicine to help prevent infection. General instructions You will have a heart evaluation. This may include an electrocardiogram (ECG), chest X-ray, and heart imaging (echocardiogram,  or echo) tests. You will have blood tests. Do not use any products that contain nicotine or tobacco, such as cigarettes and e-cigarettes. If you need help quitting, ask your health care provider. Plan to have someone take you home from the hospital or clinic. If you will be going home right after the procedure, plan to have someone with you for 24 hours. Ask your health care provider how your surgical site will be marked or identified. What happens during the procedure? To reduce your risk of infection: Your health care team will wash or sanitize their hands. Your skin will be washed with soap. Hair may be removed from the surgical area. An IV tube will be inserted into one of your veins. You will be given one or more of the following: A medicine to help you relax (sedative). A medicine to numb the area (local anesthetic). A medicine to make you fall asleep (general anesthetic). If you are getting a transvenous pacemaker: An incision will be made in your upper chest. A pocket will be made for the pacemaker. It may be placed under the skin or between layers of muscle. The lead will be inserted into a blood vessel that returns to the heart. While X-rays are taken by an imaging machine (fluoroscopy), the lead will be advanced through the vein to the inside of your heart. The other end of the lead will be tunneled  under the skin and attached to the pacemaker. If you are getting an epicardial pacemaker: An incision will be made near your ribs or breastbone (sternum) for the lead. The lead will be attached to the outside of your heart. Another incision will be made in your chest or upper belly to create a pocket for the pacemaker. The free end of the lead will be tunneled under the skin and attached to the pacemaker. The transvenous or epicardial pacemaker will be tested. Imaging studies may be done to check the lead position. The incisions will be closed with stitches (sutures), adhesive strips, or skin glue. Bandages (dressing) will be placed over the incisions. The procedure may vary among health care providers and hospitals. What happens after the procedure? Your blood pressure, heart rate, breathing rate, and blood oxygen level will be monitored until the medicines you were given have worn off. You will be given antibiotics and pain medicine. ECG and chest x-rays will be done. You will wear a continuous type of ECG (Holter monitor) to check your heart rhythm. Your health care provider will program the pacemaker. Do not drive for 24 hours if you received a sedative. This information is not intended to replace advice given to you by your health care provider. Make sure you discuss any  questions you have with your health care provider. Document Released: 06/27/2002 Document Revised: 01/25/2016 Document Reviewed: 12/19/2015 Elsevier Interactive Patient Education  2018 [employer].     Pacemaker Implantation, Adult, Care After This sheet gives you information about how to care for yourself after your procedure. Your health care provider may also give you more specific instructions. If you have problems or questions, contact your health care provider. What can I expect after the procedure? After the procedure, it is common to have: Mild pain. Slight bruising. Some swelling over the incision. A  slight bump over the skin where the device was placed. Sometimes, it is possible to feel the device under the skin. This is normal.  Follow these instructions at home: Medicines Take over-the-counter and prescription medicines only as told by your health care provider. If you were prescribed an antibiotic medicine, take it as told by your health care provider. Do not stop taking the antibiotic even if you start to feel better. Wound care Do not remove the bandage on your chest until directed to do so by your health care provider. After your bandage is removed, you may see pieces of tape called skin adhesive strips over the area where the cut was made (incision site). Let them fall off on their own. Check the incision site every day to make sure it is not infected, bleeding, or starting to pull apart. Do not use lotions or ointments near the incision site unless directed to do so. Keep the incision area clean and dry for 2-3 days after the procedure or as directed by your health care provider. It takes several weeks for the incision site to completely heal. Do not take baths, swim, or use a hot tub for 7-10 days or as otherwise directed by your health care provider. Activity Do not drive or use heavy machinery while taking prescription pain medicine. Do not drive for 24 hours if you were given a medicine to help you relax (sedative). Check with your health care provider before you start to drive or play sports. Avoid sudden jerking, pulling, or chopping movements that pull your upper arm far away from your body. Avoid these movements for at least 6 weeks or as long as told by your health care provider. Do not lift your upper arm above your shoulders for at least 6 weeks or as long as told by your health care provider. This means no tennis, golf, or swimming. You may go back to work when your health care provider says it is okay. Pacemaker care You may be shown how to transfer data from your  pacemaker through the phone to your health care provider. Always let all health care providers know about your pacemaker before you have any medical procedures or tests. Wear a medical ID bracelet or necklace stating that you have a pacemaker. Carry a pacemaker ID card with you at all times. Your pacemaker battery will last for 5-15 years. Routine checks by your health care provider will let the health care provider know when the battery is starting to run down. The pacemaker will need to be replaced when the battery starts to run down. Do not use amateur Chief of Staff. Other electrical devices are safe to use, including power tools, lawn mowers, and speakers. If you are unsure of whether something is safe to use, ask your health care provider. When using your cell phone, hold it to the ear opposite the pacemaker. Do not leave your cell phone  in a pocket over the pacemaker. Avoid places or objects that have a strong electric or magnetic field, including: Engineer, maintenance. When at the airport, let officials know that you have a pacemaker. Power plants. Large electrical generators. Radiofrequency transmission towers, such as cell phone and radio towers. General instructions Weigh yourself every day. If you suddenly gain weight, fluid may be building up in your body. Keep all follow-up visits as told by your health care provider. This is important. Contact a health care provider if: You gain weight suddenly. Your legs or feet swell. It feels like your heart is fluttering or skipping beats (heart palpitations). You have chills or a fever. You have more redness, swelling, or pain around your incisions. You have more fluid or blood coming from your incisions. Your incisions feel warm to the touch. You have pus or a bad smell coming from your incisions. Get help right away if: You have chest pain. You have trouble breathing or are short of breath. You become  extremely tired. You are light-headed or you faint. This information is not intended to replace advice given to you by your health care provider. Make sure you discuss any questions you have with your health care provider. Document Released: 01/24/2005 Document Revised: 04/18/2016 Document Reviewed: 04/18/2016 Elsevier Interactive Patient Education  2018 [place] Discharge Instructions for  Pacemaker/Defibrillator Patients  ACTIVITY No heavy lifting or vigorous activity with your left/right arm for 6 to 8 weeks.  Do not raise your left/right arm above your head for one week.  Gradually raise your affected arm as drawn below.           __  NO DRIVING for     ; you may begin driving on     .  WOUND CARE Keep the wound area clean and dry.  Do not get this area wet for one week. No showers for one week; you may shower on     . The tape/steri-strips on your wound will fall off; do not pull them off.  No bandage is needed on the site.  DO  NOT apply any creams, oils, or ointments to the wound area. If you notice any drainage or discharge from the wound, any swelling or bruising at the site, or you develop a fever > 101? F after you are discharged home, call the office at once.  SPECIAL INSTRUCTIONS You are still able to use cellular telephones; use the ear opposite the side where you have your pacemaker/defibrillator.  Avoid carrying your cellular phone near your device. When traveling through airports, show security personnel your identification card to avoid being screened in the metal detectors.  Ask the security personnel to use the hand wand. Avoid arc welding equipment, MRI testing (magnetic resonance imaging), TENS units (transcutaneous nerve stimulators).  Call the office for questions about other devices. Avoid electrical appliances that are in poor condition or are not properly grounded. Microwave ovens are safe to be near or to operate.  ADDITIONAL  INFORMATION FOR DEFIBRILLATOR PATIENTS SHOULD YOUR DEVICE GO OFF: If your device goes off ONCE and you feel fine afterward, notify the device clinic nurses. If your device goes off ONCE and you do not feel well afterward, call 911. If your device goes off TWICE, call 911. If your device goes off THREE TIMES IN ONE DAY, call 911.  DO NOT DRIVE YOURSELF OR A FAMILY MEMBER WITH A DEFIBRILLATOR TO THE HOSPITAL--CALL 911.

## 2022-07-10 NOTE — H&P (View-Only) (Signed)
Electrophysiology Office Note   Date:  07/10/2022   ID:  Cheryl Yoder, Cheryl Yoder 02-Apr-1928, MRN [id number]  PCP:  Michael Boston, MD  Cardiologist:  Angelena Form Primary Electrophysiologist:  Harriett Azar Meredith Leeds, MD    Chief Complaint: dizziness   History of Present Illness: Cheryl Yoder is a 88 y.o. female who is being seen today for the evaluation of sinus node dysfunction at the request of Jacalyn Lefevre, Jesse Sans, MD. Presenting today for electrophysiology evaluation.  She has a history significant for hypertension, hypothyroidism.  She has been having episodes of dizziness associated with shortness of breath, weakness, facial flushing.  She has had an episode of syncope.  Cardiac monitor that showed long nocturnal pauses as well as up to 4.4 seconds daytime pauses.  Today, denies symptoms of palpitations, chest pain, shortness of breath, orthopnea, PND, lower extremity edema, claudication, dizziness, presyncope, syncope, bleeding, or neurologic sequela. The patient is tolerating medications without difficulties.  She has had no further episodes of syncope.  She is currently feeling well.  She has no chest pain.  In speaking with her daughter, she does have intermittent episodes of leg weakness that last for a few seconds.  She wore a cardiac monitor that showed intermittent pauses of up to 5 seconds during waking hours.  She also had quite prolonged pauses which were all nocturnal.   Past Medical History:  Diagnosis Date   Cataract    Chest pain    GIST (gastrointestinal stromal tumor), malignant ([place]) 2008   Glaucoma    Hypertension    Hypothyroid    Kidney disease    Sensorineural hearing loss    Past Surgical History:  Procedure Laterality Date   ABDOMINAL HYSTERECTOMY     ESOPHAGOGASTRODUODENOSCOPY  08/14/2011   Procedure: ESOPHAGOGASTRODUODENOSCOPY (EGD);  Surgeon: Owens Loffler, MD;  Location: [facility];  Service: Endoscopy;  Laterality: N/A;   EUS  08/14/2011   Procedure:  UPPER ENDOSCOPIC ULTRASOUND (EUS) LINEAR;  Surgeon: Owens Loffler, MD;  Location: [facility];  Service: Endoscopy;  Laterality: N/A;   STOMACH SURGERY  2006   gist tumor removed   TOTAL HIP ARTHROPLASTY Right 12/02/2012   Procedure: RIGHT TOTAL HIP ARTHROPLASTY;  Surgeon: Tobi Bastos, MD;  Location: [facility];  Service: Orthopedics;  Laterality: Right;     Current Outpatient Medications  Medication Sig Dispense Refill   acetaminophen (TYLENOL) 500 MG tablet Take 1,000 mg by mouth every 6 (six) hours as needed for mild pain.     calcium-vitamin D (OSCAL) 250-125 MG-UNIT per tablet Take 1 tablet by mouth daily.       clopidogrel (PLAVIX) 75 MG tablet Take 1 tablet (75 mg total) by mouth daily. 30 tablet 2   hydrALAZINE (APRESOLINE) 25 MG tablet Take 25 mg by mouth daily.     latanoprost (XALATAN) 0.005 % ophthalmic solution Place 1 drop into both eyes at bedtime.     levothyroxine (SYNTHROID, LEVOTHROID) 75 MCG tablet Take 75 mcg by mouth. Monday - Saturday first thing in the morning before breakfast     losartan (COZAAR) 100 MG tablet Take 100 mg by mouth at bedtime.      pravastatin (PRAVACHOL) 20 MG tablet Take 1 tablet (20 mg total) by mouth daily at 6 PM. 30 tablet 2   No current facility-administered medications for this visit.    Allergies:   Ciprocinonide [fluocinolone], Codeine sulfate, Penicillins, and Sulfonamide derivatives   Social History:  The patient  reports that she has never smoked.  She has never used smokeless tobacco. She reports that she does not drink alcohol and does not use drugs.   Family History:  The patient's family history includes Arthritis in her sister and son; Cancer in her cousin; Diabetes in her mother and son; Rheum arthritis in her mother.   ROS:  Please see the history of present illness.   Otherwise, review of systems is positive for none.   All other systems are reviewed and negative.   PHYSICAL EXAM: VS:  BP 130/80   Pulse 66   Ht '5\' 3"'$  (1.6  m)   Wt 117 lb (53.1 kg)   SpO2 98%   BMI 20.73 kg/m  , BMI Body mass index is 20.73 kg/m. GEN: Well nourished, well developed, in no acute distress  HEENT: normal  Neck: no JVD, carotid bruits, or masses Cardiac: RRR; no murmurs, rubs, or gallops,no edema  Respiratory:  clear to auscultation bilaterally, normal work of breathing GI: soft, nontender, nondistended, + BS MS: no deformity or atrophy  Skin: warm and dry Neuro:  Strength and sensation are intact Psych: euthymic mood, full affect  EKG:  EKG is not ordered today. Personal review of the ekg ordered 05/06/22 shows sinus rhythm   Recent Labs: 05/05/2022: ALT 13 05/06/2022: BUN 17; Creatinine, Ser 0.86; Hemoglobin 11.5; Platelets 260; Potassium 3.7; Sodium 134; TSH 2.701    Lipid Panel     Component Value Date/Time   CHOL 139 05/06/2022 0342   TRIG 52 05/06/2022 0342   HDL 46 05/06/2022 0342   CHOLHDL 3.0 05/06/2022 0342   VLDL 10 05/06/2022 0342   LDLCALC 83 05/06/2022 0342     Wt Readings from Last 3 Encounters:  07/10/22 117 lb (53.1 kg)  07/04/22 116 lb 3.2 oz (52.7 kg)  06/18/22 119 lb (54 kg)      Other studies Reviewed: Additional studies/ records that were reviewed today include: Cardiac monitor 06/25/22   Review of the above records today demonstrates:  Sinus rhythm with multiple sinus pauses up to 11 seconds.   TTE 05/06/22  1. No apical thrombus with Definity contrast. Left ventricular ejection  fraction, by estimation, is 55 to 60%. The left ventricle has normal  function. The left ventricle has no regional wall motion abnormalities.  Left ventricular diastolic parameters  are consistent with Grade I diastolic dysfunction (impaired relaxation).   2. Right ventricular systolic function is low normal. The right  ventricular size is normal. There is normal pulmonary artery systolic  pressure.   3. The mitral valve is grossly normal. Trivial mitral valve  regurgitation.   4. The aortic valve  is tricuspid. Aortic valve regurgitation is not  visualized.   5. The inferior vena cava is normal in size with <50% respiratory  variability, suggesting right atrial pressure of 8 mmHg.   ASSESSMENT AND PLAN:  1.  Sick sinus syndrome: Wore cardiac monitor that showed multiple pauses.  Most pauses are during sleep, but she did have 1 pause during the day with symptoms of fatigue and weakness.  Due to that, she would require pacemaker implant.  Risk benefits discussed.  Risk of bleeding, tamponade, infection, pneumothorax, lead dislodgment.  She understands these risks and is agreed to the procedure.  2.  Hypertension: Currently well-controlled  Case discussed with primary cardiology  Current medicines are reviewed at length with the patient today.   The patient does not have concerns regarding her medicines.  The following changes were made today: None  Labs/ tests ordered  today include: none Orders Placed This Encounter  Procedures   Basic metabolic panel   CBC     Disposition:   FU with 3 months  Signed, Jenan Ellegood Meredith Leeds, MD  07/10/2022 8:46 AM     [facility] [address] [place] [address] [phone] (office) [phone] (fax)

## 2022-07-10 NOTE — Progress Notes (Signed)
Electrophysiology Office Note   Date:  07/10/2022   ID:  Emmagrace, Runkel 10-07-1927, MRN [id number]  PCP:  Michael Boston, MD  Cardiologist:  Angelena Form Primary Electrophysiologist:  July Nickson Meredith Leeds, MD    Chief Complaint: dizziness   History of Present Illness: JAMEYA PONTIFF is a 88 y.o. female who is being seen today for the evaluation of sinus node dysfunction at the request of Jacalyn Lefevre, Jesse Sans, MD. Presenting today for electrophysiology evaluation.  She has a history significant for hypertension, hypothyroidism.  She has been having episodes of dizziness associated with shortness of breath, weakness, facial flushing.  She has had an episode of syncope.  Cardiac monitor that showed long nocturnal pauses as well as up to 4.4 seconds daytime pauses.  Today, denies symptoms of palpitations, chest pain, shortness of breath, orthopnea, PND, lower extremity edema, claudication, dizziness, presyncope, syncope, bleeding, or neurologic sequela. The patient is tolerating medications without difficulties.  She has had no further episodes of syncope.  She is currently feeling well.  She has no chest pain.  In speaking with her daughter, she does have intermittent episodes of leg weakness that last for a few seconds.  She wore a cardiac monitor that showed intermittent pauses of up to 5 seconds during waking hours.  She also had quite prolonged pauses which were all nocturnal.   Past Medical History:  Diagnosis Date   Cataract    Chest pain    GIST (gastrointestinal stromal tumor), malignant ([place]) 2008   Glaucoma    Hypertension    Hypothyroid    Kidney disease    Sensorineural hearing loss    Past Surgical History:  Procedure Laterality Date   ABDOMINAL HYSTERECTOMY     ESOPHAGOGASTRODUODENOSCOPY  08/14/2011   Procedure: ESOPHAGOGASTRODUODENOSCOPY (EGD);  Surgeon: Owens Loffler, MD;  Location: [facility];  Service: Endoscopy;  Laterality: N/A;   EUS  08/14/2011   Procedure:  UPPER ENDOSCOPIC ULTRASOUND (EUS) LINEAR;  Surgeon: Owens Loffler, MD;  Location: [facility];  Service: Endoscopy;  Laterality: N/A;   STOMACH SURGERY  2006   gist tumor removed   TOTAL HIP ARTHROPLASTY Right 12/02/2012   Procedure: RIGHT TOTAL HIP ARTHROPLASTY;  Surgeon: Tobi Bastos, MD;  Location: [facility];  Service: Orthopedics;  Laterality: Right;     Current Outpatient Medications  Medication Sig Dispense Refill   acetaminophen (TYLENOL) 500 MG tablet Take 1,000 mg by mouth every 6 (six) hours as needed for mild pain.     calcium-vitamin D (OSCAL) 250-125 MG-UNIT per tablet Take 1 tablet by mouth daily.       clopidogrel (PLAVIX) 75 MG tablet Take 1 tablet (75 mg total) by mouth daily. 30 tablet 2   hydrALAZINE (APRESOLINE) 25 MG tablet Take 25 mg by mouth daily.     latanoprost (XALATAN) 0.005 % ophthalmic solution Place 1 drop into both eyes at bedtime.     levothyroxine (SYNTHROID, LEVOTHROID) 75 MCG tablet Take 75 mcg by mouth. Monday - Saturday first thing in the morning before breakfast     losartan (COZAAR) 100 MG tablet Take 100 mg by mouth at bedtime.      pravastatin (PRAVACHOL) 20 MG tablet Take 1 tablet (20 mg total) by mouth daily at 6 PM. 30 tablet 2   No current facility-administered medications for this visit.    Allergies:   Ciprocinonide [fluocinolone], Codeine sulfate, Penicillins, and Sulfonamide derivatives   Social History:  The patient  reports that she has never smoked.  She has never used smokeless tobacco. She reports that she does not drink alcohol and does not use drugs.   Family History:  The patient's family history includes Arthritis in her sister and son; Cancer in her cousin; Diabetes in her mother and son; Rheum arthritis in her mother.   ROS:  Please see the history of present illness.   Otherwise, review of systems is positive for none.   All other systems are reviewed and negative.   PHYSICAL EXAM: VS:  BP 130/80   Pulse 66   Ht '5\' 3"'$  (1.6  m)   Wt 117 lb (53.1 kg)   SpO2 98%   BMI 20.73 kg/m  , BMI Body mass index is 20.73 kg/m. GEN: Well nourished, well developed, in no acute distress  HEENT: normal  Neck: no JVD, carotid bruits, or masses Cardiac: RRR; no murmurs, rubs, or gallops,no edema  Respiratory:  clear to auscultation bilaterally, normal work of breathing GI: soft, nontender, nondistended, + BS MS: no deformity or atrophy  Skin: warm and dry Neuro:  Strength and sensation are intact Psych: euthymic mood, full affect  EKG:  EKG is not ordered today. Personal review of the ekg ordered 05/06/22 shows sinus rhythm   Recent Labs: 05/05/2022: ALT 13 05/06/2022: BUN 17; Creatinine, Ser 0.86; Hemoglobin 11.5; Platelets 260; Potassium 3.7; Sodium 134; TSH 2.701    Lipid Panel     Component Value Date/Time   CHOL 139 05/06/2022 0342   TRIG 52 05/06/2022 0342   HDL 46 05/06/2022 0342   CHOLHDL 3.0 05/06/2022 0342   VLDL 10 05/06/2022 0342   LDLCALC 83 05/06/2022 0342     Wt Readings from Last 3 Encounters:  07/10/22 117 lb (53.1 kg)  07/04/22 116 lb 3.2 oz (52.7 kg)  06/18/22 119 lb (54 kg)      Other studies Reviewed: Additional studies/ records that were reviewed today include: Cardiac monitor 06/25/22   Review of the above records today demonstrates:  Sinus rhythm with multiple sinus pauses up to 11 seconds.   TTE 05/06/22  1. No apical thrombus with Definity contrast. Left ventricular ejection  fraction, by estimation, is 55 to 60%. The left ventricle has normal  function. The left ventricle has no regional wall motion abnormalities.  Left ventricular diastolic parameters  are consistent with Grade I diastolic dysfunction (impaired relaxation).   2. Right ventricular systolic function is low normal. The right  ventricular size is normal. There is normal pulmonary artery systolic  pressure.   3. The mitral valve is grossly normal. Trivial mitral valve  regurgitation.   4. The aortic valve  is tricuspid. Aortic valve regurgitation is not  visualized.   5. The inferior vena cava is normal in size with <50% respiratory  variability, suggesting right atrial pressure of 8 mmHg.   ASSESSMENT AND PLAN:  1.  Sick sinus syndrome: Wore cardiac monitor that showed multiple pauses.  Most pauses are during sleep, but she did have 1 pause during the day with symptoms of fatigue and weakness.  Due to that, she would require pacemaker implant.  Risk benefits discussed.  Risk of bleeding, tamponade, infection, pneumothorax, lead dislodgment.  She understands these risks and is agreed to the procedure.  2.  Hypertension: Currently well-controlled  Case discussed with primary cardiology  Current medicines are reviewed at length with the patient today.   The patient does not have concerns regarding her medicines.  The following changes were made today: None  Labs/ tests ordered  today include: none Orders Placed This Encounter  Procedures   Basic metabolic panel   CBC     Disposition:   FU with 3 months  Signed, Raegyn Renda Meredith Leeds, MD  07/10/2022 8:46 AM     [facility] [address] [place] [address] [phone] (office) [phone] (fax)

## 2022-07-11 LAB — CBC
Hematocrit: 34.5 % (ref 34.0–46.6)
Hemoglobin: 12 g/dL (ref 11.1–15.9)
MCH: 29.7 pg (ref 26.6–33.0)
MCHC: 34.8 g/dL (ref 31.5–35.7)
MCV: 85 fL (ref 79–97)
Platelets: 329 x10E3/uL (ref 150–450)
RBC: 4.04 x10E6/uL (ref 3.77–5.28)
RDW: 11.3 % — ABNORMAL LOW (ref 11.7–15.4)
WBC: 7.4 x10E3/uL (ref 3.4–10.8)

## 2022-07-11 LAB — BASIC METABOLIC PANEL
BUN/Creatinine Ratio: 19 (ref 12–28)
BUN: 13 mg/dL (ref 10–36)
CO2: 24 mmol/L (ref 20–29)
Calcium: 10.2 mg/dL (ref 8.7–10.3)
Chloride: 94 mmol/L — ABNORMAL LOW (ref 96–106)
Creatinine, Ser: 0.68 mg/dL (ref 0.57–1.00)
Glucose: 90 mg/dL (ref 70–99)
Potassium: 4.7 mmol/L (ref 3.5–5.2)
Sodium: 133 mmol/L — ABNORMAL LOW (ref 134–144)
eGFR: 82 mL/min/{1.73_m2} (ref >59)

## 2022-07-12 NOTE — Progress Notes (Signed)
Kindly inform the patient that heart monitor study did not show any significant arrhythmias but showed some episodes of slowing of the heart and your cardiologist will refer you to heart rhythm specialist

## 2022-07-15 ENCOUNTER — Telehealth: Payer: Self-pay

## 2022-07-15 NOTE — Telephone Encounter (Signed)
-----   Message from Garvin Fila, MD sent at 07/12/2022  9:21 AM EST ----- Kindly inform the patient that heart monitor study did not show any significant arrhythmias but showed some episodes of slowing of the heart and your cardiologist will refer you to heart rhythm specialist

## 2022-07-23 ENCOUNTER — Other Ambulatory Visit: Payer: Self-pay | Admitting: Internal Medicine

## 2022-07-23 DIAGNOSIS — Z1231 Encounter for screening mammogram for malignant neoplasm of breast: Secondary | ICD-10-CM

## 2022-08-01 NOTE — Pre-Procedure Instructions (Signed)
Instructed patient on the following items: Arrival time 1100 Nothing to eat or drink after midnight No meds AM of procedure Responsible person to drive you home and stay with you for 24 hrs Wash with special soap night before and morning of procedure If on anti-coagulant drug instructions Plavix- stop after today

## 2022-08-04 ENCOUNTER — Other Ambulatory Visit: Payer: Self-pay

## 2022-08-04 ENCOUNTER — Ambulatory Visit (HOSPITAL_COMMUNITY): Payer: PPO

## 2022-08-04 ENCOUNTER — Ambulatory Visit (HOSPITAL_COMMUNITY)
Admission: RE | Admit: 2022-08-04 | Discharge: 2022-08-04 | Disposition: A | Discharge status: Home / Self Care | Payer: PPO | Attending: Cardiology | Admitting: Cardiology

## 2022-08-04 ENCOUNTER — Encounter (HOSPITAL_COMMUNITY)
Admission: RE | Disposition: A | Discharge status: Home / Self Care | Payer: Self-pay | Source: Home / Self Care | Attending: Cardiology

## 2022-08-04 DIAGNOSIS — Z950 Presence of cardiac pacemaker: Secondary | ICD-10-CM | POA: Diagnosis not present

## 2022-08-04 DIAGNOSIS — I700 Atherosclerosis of aorta: Secondary | ICD-10-CM | POA: Diagnosis not present

## 2022-08-04 DIAGNOSIS — I495 Sick sinus syndrome: Secondary | ICD-10-CM

## 2022-08-04 DIAGNOSIS — I442 Atrioventricular block, complete: Secondary | ICD-10-CM | POA: Diagnosis not present

## 2022-08-04 DIAGNOSIS — E039 Hypothyroidism, unspecified: Secondary | ICD-10-CM | POA: Insufficient documentation

## 2022-08-04 DIAGNOSIS — I459 Conduction disorder, unspecified: Secondary | ICD-10-CM | POA: Diagnosis not present

## 2022-08-04 DIAGNOSIS — I10 Essential (primary) hypertension: Secondary | ICD-10-CM | POA: Insufficient documentation

## 2022-08-04 HISTORY — PX: PACEMAKER IMPLANT: EP1218

## 2022-08-04 SURGERY — PACEMAKER IMPLANT

## 2022-08-04 MED ORDER — LIDOCAINE HCL (PF) 1 % IJ SOLN
INTRAMUSCULAR | Status: DC | PRN
  Administered 2022-08-04: 60 mL

## 2022-08-04 MED ORDER — LOSARTAN POTASSIUM 50 MG PO TABS
100.0000 mg | ORAL_TABLET | Freq: Once | ORAL | Status: AC
  Administered 2022-08-04: 100 mg via ORAL
  Filled 2022-08-04: qty 2

## 2022-08-04 MED ORDER — FENTANYL CITRATE (PF) 100 MCG/2ML IJ SOLN
INTRAMUSCULAR | Status: AC
  Filled 2022-08-04: qty 2

## 2022-08-04 MED ORDER — CHLORHEXIDINE GLUCONATE 4 % EX LIQD: 4.0000 | Freq: Once | CUTANEOUS | Status: DC

## 2022-08-04 MED ORDER — ONDANSETRON HCL 4 MG/2ML IJ SOLN: 4.0000 mg | Freq: Four times a day (QID) | INTRAMUSCULAR | Status: DC | PRN

## 2022-08-04 MED ORDER — SODIUM CHLORIDE 0.9 % IV SOLN
INTRAVENOUS | Status: AC
  Filled 2022-08-04: qty 2

## 2022-08-04 MED ORDER — VANCOMYCIN HCL IN DEXTROSE 1-5 GM/200ML-% IV SOLN
1000.0000 mg | INTRAVENOUS | Status: AC
  Administered 2022-08-04: 1000 mg via INTRAVENOUS

## 2022-08-04 MED ORDER — HYDRALAZINE HCL 20 MG/ML IJ SOLN
INTRAMUSCULAR | Status: AC
  Filled 2022-08-04: qty 1

## 2022-08-04 MED ORDER — LIDOCAINE HCL (PF) 1 % IJ SOLN
INTRAMUSCULAR | Status: AC
  Filled 2022-08-04: qty 30

## 2022-08-04 MED ORDER — ACETAMINOPHEN 325 MG PO TABS
325.0000 mg | ORAL_TABLET | ORAL | Status: DC | PRN
  Administered 2022-08-04: 650 mg via ORAL
  Filled 2022-08-04: qty 2

## 2022-08-04 MED ORDER — MIDAZOLAM HCL 5 MG/5ML IJ SOLN
INTRAMUSCULAR | Status: DC | PRN
  Administered 2022-08-04: 1 mg via INTRAVENOUS

## 2022-08-04 MED ORDER — SODIUM CHLORIDE 0.9 % IV SOLN: INTRAVENOUS | Status: DC

## 2022-08-04 MED ORDER — VANCOMYCIN HCL IN DEXTROSE 1-5 GM/200ML-% IV SOLN
INTRAVENOUS | Status: AC
  Filled 2022-08-04: qty 200

## 2022-08-04 MED ORDER — VANCOMYCIN HCL IN DEXTROSE 1-5 GM/200ML-% IV SOLN: 1000.0000 mg | Freq: Two times a day (BID) | INTRAVENOUS | Status: DC

## 2022-08-04 MED ORDER — SODIUM CHLORIDE 0.9 % IV SOLN
80.0000 mg | INTRAVENOUS | Status: AC
  Administered 2022-08-04: 80 mg

## 2022-08-04 MED ORDER — MIDAZOLAM HCL 5 MG/5ML IJ SOLN
INTRAMUSCULAR | Status: AC
  Filled 2022-08-04: qty 5

## 2022-08-04 MED ORDER — HYDRALAZINE HCL 20 MG/ML IJ SOLN
INTRAMUSCULAR | Status: DC | PRN
  Administered 2022-08-04 (×2): 5 mg via INTRAVENOUS

## 2022-08-04 MED ORDER — HYDRALAZINE HCL 25 MG PO TABS
25.0000 mg | ORAL_TABLET | Freq: Once | ORAL | Status: AC
  Administered 2022-08-04: 25 mg via ORAL
  Filled 2022-08-04: qty 1

## 2022-08-04 MED ORDER — FENTANYL CITRATE (PF) 100 MCG/2ML IJ SOLN
INTRAMUSCULAR | Status: DC | PRN
  Administered 2022-08-04: 25 ug via INTRAVENOUS

## 2022-08-04 MED ORDER — HEPARIN (PORCINE) IN NACL 1000-0.9 UT/500ML-% IV SOLN
INTRAVENOUS | Status: DC | PRN
  Administered 2022-08-04: 500 mL

## 2022-08-04 SURGICAL SUPPLY — 13 items
CABLE SURGICAL S-101-97-12 (CABLE) ×2 IMPLANT
CATH RIGHTSITE C315HIS02 (CATHETERS) IMPLANT
IPG PACE AZUR XT DR MRI W1DR01 (Pacemaker) IMPLANT
LEAD CAPSURE NOVUS 5076-52CM (Lead) IMPLANT
LEAD SELECT SECURE 3830 383069 (Lead) IMPLANT
PACE AZURE XT DR MRI W1DR01 (Pacemaker) ×1 IMPLANT
PAD DEFIB RADIO PHYSIO CONN (PAD) ×2 IMPLANT
SELECT SECURE 3830 383069 (Lead) ×1 IMPLANT
SHEATH 7FR PRELUDE SNAP 13 (SHEATH) IMPLANT
SHEATH PROBE COVER 6X72 (BAG) IMPLANT
SLITTER 6232ADJ (MISCELLANEOUS) IMPLANT
TRAY PACEMAKER INSERTION (PACKS) ×2 IMPLANT
WIRE HI TORQ VERSACORE-J 145CM (WIRE) IMPLANT

## 2022-08-04 NOTE — Progress Notes (Signed)
Xray report called to Dr. Curt Bears by [facility] in xray. Pt is ok for discharge.

## 2022-08-04 NOTE — Discharge Instructions (Signed)
.After Your Pacemaker   You have a Medtronic Pacemaker  ACTIVITY Do not lift your arm above shoulder height for 1 week after your procedure. After 7 days, you may progress as below.  You should remove your sling 24 hours after your procedure, unless otherwise instructed by your provider.     Monday August 11, 2022  Tuesday August 12, 2022 Wednesday August 13, 2022 Thursday August 14, 2022   Do not lift, push, pull, or carry anything over 10 pounds with the affected arm until 6 weeks (Monday September 15, 2022 ) after your procedure.   You may drive AFTER your wound check, unless you have been told otherwise by your provider.   Ask your healthcare provider when you can go back to work   INCISION/Dressing If you are on a blood thinner such as Coumadin, Xarelto, Eliquis, Plavix, or Pradaxa please confirm with your provider when this should be resumed.   If large square, outer bandage is left in place, this can be removed after 24 hours from your procedure. Do not remove steri-strips or glue as below.   Monitor your Pacemaker site for redness, swelling, and drainage. Call the device clinic at [phone] if you experience these symptoms or fever/chills.  If your incision is sealed with Steri-strips or staples, you may shower 7 days after your procedure or when told by your provider. Do not remove the steri-strips or let the shower hit directly on your site. You may wash around your site with soap and water.    If you were discharged in a sling, please do not wear this during the day more than 48 hours after your surgery unless otherwise instructed. This may increase the risk of stiffness and soreness in your shoulder.   Avoid lotions, ointments, or perfumes over your incision until it is well-healed.  You may use a hot tub or a pool AFTER your wound check appointment if the incision is completely closed.  Pacemaker Alerts:  Some alerts are vibratory and others beep. These are NOT  emergencies. Please call our office to let us know. If this occurs at night or on weekends, it can wait until the next business day. Send a remote transmission.  If your device is capable of reading fluid status (for heart failure), you will be offered monthly monitoring to review this with you.   DEVICE MANAGEMENT Remote monitoring is used to monitor your pacemaker from home. This monitoring is scheduled every 91 days by our office. It allows [place] to keep an eye on the functioning of your device to ensure it is working properly. You will routinely see your Electrophysiologist annually (more often if necessary).   You should receive your ID card for your new device in 4-8 weeks. Keep this card with you at all times once received. Consider wearing a medical alert bracelet or necklace.  Your Pacemaker may be MRI compatible. This will be discussed at your next office visit/wound check.  You should avoid contact with strong electric or magnetic fields.   Do not use amateur (ham) radio equipment or electric (arc) welding torches. MP3 player headphones with magnets should not be used. Some devices are safe to use if held at least 12 inches (30 cm) from your Pacemaker. These include power tools, lawn mowers, and speakers. If you are unsure if something is safe to use, ask your health care provider.  When using your cell phone, hold it to the ear that is on the opposite side from the  Pacemaker. Do not leave your cell phone in a pocket over the Pacemaker.  You may safely use electric blankets, heating pads, computers, and microwave ovens.  Call the office right away if: You have chest pain. You feel more short of breath than you have felt before. You feel more light-headed than you have felt before. Your incision starts to open up.  This information is not intended to replace advice given to you by your health care provider. Make sure you discuss any questions you have with your health care provider.

## 2022-08-04 NOTE — Interval H&P Note (Signed)
History and Physical Interval Note:  08/04/2022 12:25 PM  Cheryl Yoder  has presented today for surgery, with the diagnosis of BRADYCARDIA.  The various methods of treatment have been discussed with the patient and family. After consideration of risks, benefits and other options for treatment, the patient has consented to  Procedure(s): PACEMAKER IMPLANT (N/A) as a surgical intervention.  The patient's history has been reviewed, patient examined, no change in status, stable for surgery.  I have reviewed the patient's chart and labs.  Questions were answered to the patient's satisfaction.     Terral Cooks [employer]

## 2022-08-05 ENCOUNTER — Encounter (HOSPITAL_COMMUNITY): Payer: Self-pay | Admitting: Cardiology

## 2022-08-16 NOTE — Progress Notes (Unsigned)
Cardiology Office Note Date:  08/16/2022  Patient ID:  Cheryl Yoder, Cheryl Yoder 07/21/1928, MRN [id number] PCP:  Michael Boston, MD  Cardiologist:  Santina Evans Electrophysiologist: Dr. Curt Bears  ***refresh   Chief Complaint: *** wound check  History of Present Illness: Cheryl Yoder is a 89 y.o. female with history of hypothyroidism, ?TIA, HTN, SSSx w/PPM  As part of outpt w/u after an ER visit for possible TIA, she underwent TTE No apical thrombus, EF 55-60, no RWMA, G1 DD, low normal RVSF, normal PASP, trivial MR, RAP 8, RVSP 25.8. Monitoring disclosed  several long pauses (longest 11 sec). She did have an evaluation with EP (Dr. Curt Bears). The long pauses were during sleep, planned for monitoring of symptoms.   When she saw Dr. Curt Bears 07/10/22, no recurrent syncope, + episodes of feeling transient leg weakness, in review of data, noted daytime pausing as long as 4-5 seconds. With some symptoms, planned for PPM  *** site *** restrictions   Device information MDT dual chamber PPM implanted 08/04/22   Past Medical History:  Diagnosis Date   Cataract    Chest pain    GIST (gastrointestinal stromal tumor), malignant ([place]) 2008   Glaucoma    Hypertension    Hypothyroid    Kidney disease    Sensorineural hearing loss     Past Surgical History:  Procedure Laterality Date   ABDOMINAL HYSTERECTOMY     ESOPHAGOGASTRODUODENOSCOPY  08/14/2011   Procedure: ESOPHAGOGASTRODUODENOSCOPY (EGD);  Surgeon: Owens Loffler, MD;  Location: [facility];  Service: Endoscopy;  Laterality: N/A;   EUS  08/14/2011   Procedure: UPPER ENDOSCOPIC ULTRASOUND (EUS) LINEAR;  Surgeon: Owens Loffler, MD;  Location: [facility];  Service: Endoscopy;  Laterality: N/A;   PACEMAKER IMPLANT N/A 08/04/2022   Procedure: PACEMAKER IMPLANT;  Surgeon: Constance Haw, MD;  Location: [facility];  Service: Cardiovascular;  Laterality: N/A;   STOMACH SURGERY  2006   gist tumor removed   TOTAL HIP  ARTHROPLASTY Right 12/02/2012   Procedure: RIGHT TOTAL HIP ARTHROPLASTY;  Surgeon: Tobi Bastos, MD;  Location: [facility];  Service: Orthopedics;  Laterality: Right;    Current Outpatient Medications  Medication Sig Dispense Refill   acetaminophen (TYLENOL) 500 MG tablet Take 1,000 mg by mouth every 6 (six) hours as needed for mild pain.     calcium carbonate (CALCIUM 600) 600 MG TABS tablet Take 600 mg by mouth daily with breakfast.     Cholecalciferol (VITAMIN D3) 50 MCG (2000 UT) capsule Take 2,000 Units by mouth daily.     clopidogrel (PLAVIX) 75 MG tablet Take 1 tablet (75 mg total) by mouth daily. 30 tablet 2   hydrALAZINE (APRESOLINE) 25 MG tablet Take 12.5 mg by mouth daily.     latanoprost (XALATAN) 0.005 % ophthalmic solution Place 1 drop into both eyes at bedtime.     levothyroxine (SYNTHROID, LEVOTHROID) 75 MCG tablet Take 75 mcg by mouth daily before breakfast. Monday - Saturday first thing in the morning before breakfast     losartan (COZAAR) 100 MG tablet Take 100 mg by mouth at bedtime.      pravastatin (PRAVACHOL) 20 MG tablet Take 1 tablet (20 mg total) by mouth daily at 6 PM. 30 tablet 2   No current facility-administered medications for this visit.    Allergies:   Ciprocinonide [fluocinolone], Codeine sulfate, Penicillins, and Sulfonamide derivatives   Social History:  The patient  reports that she has never smoked. She has never  used smokeless tobacco. She reports that she does not drink alcohol and does not use drugs.   Family History:  The patient's family history includes Arthritis in her sister and son; Cancer in her cousin; Diabetes in her mother and son; Rheum arthritis in her mother.  ROS:  Please see the history of present illness.    All other systems are reviewed and otherwise negative.   PHYSICAL EXAM:  VS:  There were no vitals taken for this visit. BMI: There is no height or weight on file to calculate BMI. Well nourished, well developed, in no acute  distress HEENT: normocephalic, atraumatic Neck: no JVD, carotid bruits or masses Cardiac:  *** RRR; no significant murmurs, no rubs, or gallops Lungs:  *** CTA b/l, no wheezing, rhonchi or rales Abd: soft, nontender MS: no deformity or *** atrophy Ext: *** no edema Skin: warm and dry, no rash Neuro:  No gross deficits appreciated Psych: euthymic mood, full affect  *** PPM site is stable, no tethering or discomfort   EKG:  not done today  Device interrogation done today and reviewed by myself:  ***   05/06/22: TTE 1. No apical thrombus with Definity contrast. Left ventricular ejection  fraction, by estimation, is 55 to 60%. The left ventricle has normal  function. The left ventricle has no regional wall motion abnormalities.  Left ventricular diastolic parameters  are consistent with Grade I diastolic dysfunction (impaired relaxation).   2. Right ventricular systolic function is low normal. The right  ventricular size is normal. There is normal pulmonary artery systolic  pressure.   3. The mitral valve is grossly normal. Trivial mitral valve  regurgitation.   4. The aortic valve is tricuspid. Aortic valve regurgitation is not  visualized.   5. The inferior vena cava is normal in size with <50% respiratory  variability, suggesting right atrial pressure of 8 mmHg.   Comparison(s): No prior Echocardiogram.    Recent Labs: 05/05/2022: ALT 13 05/06/2022: TSH 2.701 07/10/2022: BUN 13; Creatinine, Ser 0.68; Hemoglobin 12.0; Platelets 329; Potassium 4.7; Sodium 133  05/06/2022: Cholesterol 139; HDL 46; LDL Cholesterol 83; Total CHOL/HDL Ratio 3.0; Triglycerides 52; VLDL 10   CrCl cannot be calculated (Patient's most recent lab result is older than the maximum 21 days allowed.).   Wt Readings from Last 3 Encounters:  08/04/22 115 lb (52.2 kg)  07/10/22 117 lb (53.1 kg)  07/04/22 116 lb 3.2 oz (52.7 kg)     Other studies reviewed: Additional studies/records reviewed today  include: summarized above  ASSESSMENT AND PLAN:  PPM ***  HTN ***  Disposition: F/u with ***  Current medicines are reviewed at length with the patient today.  The patient did not have any concerns regarding medicines.  Venetia Night, PA-C 08/16/2022 11:09 AM     [facility] [place] [address] [phone] (office)  [phone] (fax)

## 2022-08-18 ENCOUNTER — Encounter: Payer: Self-pay | Admitting: Physician Assistant

## 2022-08-18 ENCOUNTER — Ambulatory Visit: Payer: PPO | Attending: Physician Assistant | Admitting: Physician Assistant

## 2022-08-18 VITALS — BP 190/80 | HR 79 | Ht 63.0 in | Wt 118.2 lb

## 2022-08-18 DIAGNOSIS — Z5189 Encounter for other specified aftercare: Secondary | ICD-10-CM | POA: Diagnosis not present

## 2022-08-18 DIAGNOSIS — Z950 Presence of cardiac pacemaker: Secondary | ICD-10-CM | POA: Diagnosis not present

## 2022-08-18 DIAGNOSIS — I10 Essential (primary) hypertension: Secondary | ICD-10-CM | POA: Diagnosis not present

## 2022-08-18 LAB — CUP PACEART INCLINIC DEVICE CHECK
Battery Remaining Longevity: 168 mo
Battery Voltage: 3.22 V
Brady Statistic AP VP Percent: 0.03 %
Brady Statistic AP VS Percent: 28.34 %
Brady Statistic AS VP Percent: 0.02 %
Brady Statistic AS VS Percent: 71.62 %
Brady Statistic RA Percent Paced: 28.45 %
Brady Statistic RV Percent Paced: 0.05 %
Date Time Interrogation Session: 20240129174607
Implantable Lead Connection Status: 753985
Implantable Lead Connection Status: 753985
Implantable Lead Implant Date: 20240115
Implantable Lead Implant Date: 20240115
Implantable Lead Location: 753859
Implantable Lead Location: 753860
Implantable Lead Model: 3830
Implantable Lead Model: 5076
Implantable Pulse Generator Implant Date: 20240115
Lead Channel Impedance Value: 285 Ohm
Lead Channel Impedance Value: 361 Ohm
Lead Channel Impedance Value: 494 Ohm
Lead Channel Impedance Value: 627 Ohm
Lead Channel Pacing Threshold Amplitude: 0.875 V
Lead Channel Pacing Threshold Amplitude: 1 V
Lead Channel Pacing Threshold Pulse Width: 0.4 ms
Lead Channel Pacing Threshold Pulse Width: 0.4 ms
Lead Channel Sensing Intrinsic Amplitude: 0.875 mV
Lead Channel Sensing Intrinsic Amplitude: 1.375 mV
Lead Channel Sensing Intrinsic Amplitude: 10 mV
Lead Channel Sensing Intrinsic Amplitude: 9.25 mV
Lead Channel Setting Pacing Amplitude: 3.5 V
Lead Channel Setting Pacing Amplitude: 3.5 V
Lead Channel Setting Pacing Pulse Width: 0.4 ms
Lead Channel Setting Sensing Sensitivity: 0.9 mV
Zone Setting Status: 755011
Zone Setting Status: 755011

## 2022-08-18 NOTE — Patient Instructions (Signed)
Medication Instructions:   Your physician recommends that you continue on your current medications as directed. Please refer to the Current Medication list given to you today.  *If you need a refill on your cardiac medications before your next appointment, please call your pharmacy*   Lab Work: [place]   If you have labs (blood work) drawn today and your tests are completely normal, you will receive your results only by: [place] (if you have MyChart) OR A paper copy in the mail If you have any lab test that is abnormal or we need to change your treatment, we will call you to review the results.   Testing/Procedures: NONE ORDERED  TODAY     Follow-Up: At [facility], you and your health needs are our priority.  As part of our continuing mission to provide you with exceptional heart care, we have created designated Provider Care Teams.  These Care Teams include your primary Cardiologist (physician) and Advanced Practice Providers (APPs -  Physician Assistants and Nurse Practitioners) who all work together to provide you with the care you need, when you need it.  We recommend signing up for the patient portal called "MyChart".  Sign up information is provided on this After Visit Summary.  MyChart is used to connect with patients for Virtual Visits (Telemedicine).  Patients are able to view lab/test results, encounter notes, upcoming appointments, etc.  Non-urgent messages can be sent to your provider as well.   To learn more about what you can do with MyChart, go to [email or web address].    Your next appointment:  AS SCHEDULED   Provider:   Allegra Lai, MD    Other Instructions

## 2022-08-26 DIAGNOSIS — H401121 Primary open-angle glaucoma, left eye, mild stage: Secondary | ICD-10-CM | POA: Diagnosis not present

## 2022-08-26 DIAGNOSIS — H401112 Primary open-angle glaucoma, right eye, moderate stage: Secondary | ICD-10-CM | POA: Diagnosis not present

## 2022-08-26 DIAGNOSIS — Z961 Presence of intraocular lens: Secondary | ICD-10-CM | POA: Diagnosis not present

## 2022-09-01 ENCOUNTER — Ambulatory Visit: Payer: PPO | Admitting: Physician Assistant

## 2022-10-22 ENCOUNTER — Ambulatory Visit
Admission: RE | Admit: 2022-10-22 | Discharge: 2022-10-22 | Disposition: A | Discharge status: Home / Self Care | Payer: PPO | Source: Ambulatory Visit | Attending: Internal Medicine | Admitting: Internal Medicine

## 2022-10-22 DIAGNOSIS — Z1231 Encounter for screening mammogram for malignant neoplasm of breast: Secondary | ICD-10-CM

## 2022-10-31 DIAGNOSIS — E039 Hypothyroidism, unspecified: Secondary | ICD-10-CM | POA: Diagnosis not present

## 2022-10-31 DIAGNOSIS — I10 Essential (primary) hypertension: Secondary | ICD-10-CM | POA: Diagnosis not present

## 2022-10-31 DIAGNOSIS — R7989 Other specified abnormal findings of blood chemistry: Secondary | ICD-10-CM | POA: Diagnosis not present

## 2022-10-31 DIAGNOSIS — M859 Disorder of bone density and structure, unspecified: Secondary | ICD-10-CM | POA: Diagnosis not present

## 2022-11-04 ENCOUNTER — Ambulatory Visit (INDEPENDENT_AMBULATORY_CARE_PROVIDER_SITE_OTHER): Payer: PPO

## 2022-11-04 DIAGNOSIS — I495 Sick sinus syndrome: Secondary | ICD-10-CM

## 2022-11-04 LAB — CUP PACEART REMOTE DEVICE CHECK
Battery Remaining Longevity: 178 mo
Battery Voltage: 3.21 V
Brady Statistic AP VP Percent: 0.03 %
Brady Statistic AP VS Percent: 16.61 %
Brady Statistic AS VP Percent: 0.06 %
Brady Statistic AS VS Percent: 83.3 %
Brady Statistic RA Percent Paced: 16.73 %
Brady Statistic RV Percent Paced: 0.09 %
Date Time Interrogation Session: 20240416131728
Implantable Lead Connection Status: 753985
Implantable Lead Connection Status: 753985
Implantable Lead Implant Date: 20240115
Implantable Lead Implant Date: 20240115
Implantable Lead Location: 753859
Implantable Lead Location: 753860
Implantable Lead Model: 3830
Implantable Lead Model: 5076
Implantable Pulse Generator Implant Date: 20240115
Lead Channel Impedance Value: 266 Ohm
Lead Channel Impedance Value: 399 Ohm
Lead Channel Impedance Value: 437 Ohm
Lead Channel Impedance Value: 551 Ohm
Lead Channel Pacing Threshold Amplitude: 0.75 V
Lead Channel Pacing Threshold Amplitude: 1.125 V
Lead Channel Pacing Threshold Pulse Width: 0.4 ms
Lead Channel Pacing Threshold Pulse Width: 0.4 ms
Lead Channel Sensing Intrinsic Amplitude: 1.25 mV
Lead Channel Sensing Intrinsic Amplitude: 1.25 mV
Lead Channel Sensing Intrinsic Amplitude: 9 mV
Lead Channel Sensing Intrinsic Amplitude: 9 mV
Lead Channel Setting Pacing Amplitude: 1.5 V
Lead Channel Setting Pacing Amplitude: 2.25 V
Lead Channel Setting Pacing Pulse Width: 0.4 ms
Lead Channel Setting Sensing Sensitivity: 0.9 mV
Zone Setting Status: 755011
Zone Setting Status: 755011

## 2022-11-07 DIAGNOSIS — G459 Transient cerebral ischemic attack, unspecified: Secondary | ICD-10-CM | POA: Diagnosis not present

## 2022-11-07 DIAGNOSIS — L819 Disorder of pigmentation, unspecified: Secondary | ICD-10-CM | POA: Diagnosis not present

## 2022-11-07 DIAGNOSIS — I700 Atherosclerosis of aorta: Secondary | ICD-10-CM | POA: Diagnosis not present

## 2022-11-07 DIAGNOSIS — Z0000 Encounter for general adult medical examination without abnormal findings: Secondary | ICD-10-CM | POA: Diagnosis not present

## 2022-11-07 DIAGNOSIS — I129 Hypertensive chronic kidney disease with stage 1 through stage 4 chronic kidney disease, or unspecified chronic kidney disease: Secondary | ICD-10-CM | POA: Diagnosis not present

## 2022-11-07 DIAGNOSIS — E039 Hypothyroidism, unspecified: Secondary | ICD-10-CM | POA: Diagnosis not present

## 2022-11-07 DIAGNOSIS — Z1339 Encounter for screening examination for other mental health and behavioral disorders: Secondary | ICD-10-CM | POA: Diagnosis not present

## 2022-11-07 DIAGNOSIS — N182 Chronic kidney disease, stage 2 (mild): Secondary | ICD-10-CM | POA: Diagnosis not present

## 2022-11-07 DIAGNOSIS — Z1331 Encounter for screening for depression: Secondary | ICD-10-CM | POA: Diagnosis not present

## 2022-11-07 DIAGNOSIS — M8580 Other specified disorders of bone density and structure, unspecified site: Secondary | ICD-10-CM | POA: Diagnosis not present

## 2022-11-10 ENCOUNTER — Ambulatory Visit: Payer: PPO | Admitting: Cardiology

## 2022-12-08 NOTE — Progress Notes (Signed)
Remote pacemaker transmission.   

## 2022-12-26 DIAGNOSIS — M25511 Pain in right shoulder: Secondary | ICD-10-CM | POA: Diagnosis not present

## 2023-01-14 ENCOUNTER — Encounter: Payer: Self-pay | Admitting: Cardiology

## 2023-01-14 ENCOUNTER — Ambulatory Visit: Payer: PPO | Attending: Cardiology | Admitting: Cardiology

## 2023-01-14 VITALS — BP 160/100 | HR 68 | Ht 63.0 in | Wt 117.8 lb

## 2023-01-14 DIAGNOSIS — I495 Sick sinus syndrome: Secondary | ICD-10-CM

## 2023-01-14 DIAGNOSIS — I10 Essential (primary) hypertension: Secondary | ICD-10-CM | POA: Diagnosis not present

## 2023-01-14 MED ORDER — HYDRALAZINE HCL 25 MG PO TABS: 25.0000 mg | ORAL_TABLET | Freq: Three times a day (TID) | ORAL | 3 refills | Status: AC

## 2023-01-14 NOTE — Progress Notes (Signed)
  Electrophysiology Office Note:   Date:  01/14/2023  ID:  Cheryl Yoder, DOB 1927/10/20, MRN [id number]  Primary Cardiologist: Verne Carrow, MD Electrophysiologist: Regan Lemming, MD      History of Present Illness:   Cheryl Yoder is a 89 y.o. female with h/o, pacemaker, sick sinus syndrome seen today for routine electrophysiology followup.  Since last being seen in our clinic the patient reports doing well.  Since her pacemaker was implanted she has done well.  She has had no further episodes of syncope or near syncope.  She feels that she has more energy.  she denies chest pain, palpitations, dyspnea, PND, orthopnea, nausea, vomiting, dizziness, syncope, edema, weight gain, or early satiety.   Review of systems complete and found to be negative unless listed in HPI.      EP information / Studies Reviewed:    EKG is ordered today. Personal review as below.  EKG Interpretation  Date/Time:  Wednesday January 14 2023 16:41:48 EDT Ventricular Rate:  68 PR Interval:  186 QRS Duration: 84 QT Interval:  394 QTC Calculation: 418 R Axis:   72 Text Interpretation: Normal sinus rhythm Normal ECG When compared with ECG of 04-Aug-2022 16:59, No significant change was found Confirmed by Mitzy Naron ([id number]) on 01/14/2023 5:04:58 PM   PPM Interrogation-  reviewed in detail today,  See PACEART report.  Device History: Medtronic Dual Chamber PPM implanted 08/04/22 for Sick sinus syndrome  Risk Assessment/Calculations:      Physical Exam:   VS:  BP (!) 160/100   Pulse 68   Ht 5\' 3"  (1.6 m)   Wt 117 lb 12.8 oz (53.4 kg)   SpO2 98%   BMI 20.87 kg/m    Wt Readings from Last 3 Encounters:  01/14/23 117 lb 12.8 oz (53.4 kg)  08/18/22 118 lb 3.2 oz (53.6 kg)  08/04/22 115 lb (52.2 kg)     GEN: Well nourished, well developed in no acute distress NECK: No JVD; No carotid bruits CARDIAC: Regular rate and rhythm, no murmurs, rubs, gallops RESPIRATORY:  Clear to  auscultation without rales, wheezing or rhonchi  ABDOMEN: Soft, non-tender, non-distended EXTREMITIES:  No edema; No deformity   ASSESSMENT AND PLAN:    Sick sinus syndrome s/p Medtronic PPM  Normal PPM function See Pace Art report No changes today  Hypertension: Blood pressure significantly elevated today.  Is been managed previously by her primary physician.  She was having dizziness on a higher dose of hydralazine.  Dejanira Pamintuan increase it to 25 mg twice daily.  Medications can further be adjusted by her primary physician.  Disposition:   Follow up with EP APP in 12 months  Signed, Afshin Chrystal Jorja Loa, MD

## 2023-01-31 ENCOUNTER — Encounter: Payer: Self-pay | Admitting: Cardiology

## 2023-02-02 NOTE — Telephone Encounter (Signed)
Patient's daughter called in and I informed her that 7/16 appointment is a remote device check only--FYI.

## 2023-02-03 ENCOUNTER — Ambulatory Visit (INDEPENDENT_AMBULATORY_CARE_PROVIDER_SITE_OTHER): Payer: PPO

## 2023-02-03 DIAGNOSIS — I495 Sick sinus syndrome: Secondary | ICD-10-CM

## 2023-02-04 LAB — CUP PACEART REMOTE DEVICE CHECK
Battery Remaining Longevity: 176 mo
Battery Voltage: 3.19 V
Brady Statistic AP VP Percent: 0.02 %
Brady Statistic AP VS Percent: 12.29 %
Brady Statistic AS VP Percent: 0.02 %
Brady Statistic AS VS Percent: 87.67 %
Brady Statistic RA Percent Paced: 12.34 %
Brady Statistic RV Percent Paced: 0.05 %
Date Time Interrogation Session: 20240715235052
Implantable Lead Connection Status: 753985
Implantable Lead Connection Status: 753985
Implantable Lead Implant Date: 20240115
Implantable Lead Implant Date: 20240115
Implantable Lead Location: 753859
Implantable Lead Location: 753860
Implantable Lead Model: 3830
Implantable Lead Model: 5076
Implantable Pulse Generator Implant Date: 20240115
Lead Channel Impedance Value: 285 Ohm
Lead Channel Impedance Value: 437 Ohm
Lead Channel Impedance Value: 475 Ohm
Lead Channel Impedance Value: 551 Ohm
Lead Channel Pacing Threshold Amplitude: 0.875 V
Lead Channel Pacing Threshold Amplitude: 1.125 V
Lead Channel Pacing Threshold Pulse Width: 0.4 ms
Lead Channel Pacing Threshold Pulse Width: 0.4 ms
Lead Channel Sensing Intrinsic Amplitude: 1.625 mV
Lead Channel Sensing Intrinsic Amplitude: 1.625 mV
Lead Channel Sensing Intrinsic Amplitude: 8.5 mV
Lead Channel Sensing Intrinsic Amplitude: 8.5 mV
Lead Channel Setting Pacing Amplitude: 1.75 V
Lead Channel Setting Pacing Amplitude: 2.25 V
Lead Channel Setting Pacing Pulse Width: 0.4 ms
Lead Channel Setting Sensing Sensitivity: 0.9 mV
Zone Setting Status: 755011
Zone Setting Status: 755011

## 2023-02-13 DIAGNOSIS — E039 Hypothyroidism, unspecified: Secondary | ICD-10-CM | POA: Diagnosis not present

## 2023-02-13 DIAGNOSIS — G459 Transient cerebral ischemic attack, unspecified: Secondary | ICD-10-CM | POA: Diagnosis not present

## 2023-02-13 DIAGNOSIS — I700 Atherosclerosis of aorta: Secondary | ICD-10-CM | POA: Diagnosis not present

## 2023-02-13 DIAGNOSIS — I495 Sick sinus syndrome: Secondary | ICD-10-CM | POA: Diagnosis not present

## 2023-02-13 DIAGNOSIS — I129 Hypertensive chronic kidney disease with stage 1 through stage 4 chronic kidney disease, or unspecified chronic kidney disease: Secondary | ICD-10-CM | POA: Diagnosis not present

## 2023-02-13 DIAGNOSIS — N182 Chronic kidney disease, stage 2 (mild): Secondary | ICD-10-CM | POA: Diagnosis not present

## 2023-02-13 DIAGNOSIS — M25511 Pain in right shoulder: Secondary | ICD-10-CM | POA: Diagnosis not present

## 2023-02-13 DIAGNOSIS — L819 Disorder of pigmentation, unspecified: Secondary | ICD-10-CM | POA: Diagnosis not present

## 2023-02-19 NOTE — Progress Notes (Signed)
Remote pacemaker transmission.   

## 2023-02-24 DIAGNOSIS — Z961 Presence of intraocular lens: Secondary | ICD-10-CM | POA: Diagnosis not present

## 2023-02-24 DIAGNOSIS — H401131 Primary open-angle glaucoma, bilateral, mild stage: Secondary | ICD-10-CM | POA: Diagnosis not present

## 2023-05-05 ENCOUNTER — Ambulatory Visit (INDEPENDENT_AMBULATORY_CARE_PROVIDER_SITE_OTHER): Payer: PPO

## 2023-05-05 DIAGNOSIS — I495 Sick sinus syndrome: Secondary | ICD-10-CM

## 2023-05-06 LAB — CUP PACEART REMOTE DEVICE CHECK
Battery Remaining Longevity: 172 mo
Battery Voltage: 3.16 V
Brady Statistic AP VP Percent: 0.03 %
Brady Statistic AP VS Percent: 13.22 %
Brady Statistic AS VP Percent: 0.04 %
Brady Statistic AS VS Percent: 86.72 %
Brady Statistic RA Percent Paced: 13.31 %
Brady Statistic RV Percent Paced: 0.09 %
Date Time Interrogation Session: 20241014234224
Implantable Lead Connection Status: 753985
Implantable Lead Connection Status: 753985
Implantable Lead Implant Date: 20240115
Implantable Lead Implant Date: 20240115
Implantable Lead Location: 753859
Implantable Lead Location: 753860
Implantable Lead Model: 3830
Implantable Lead Model: 5076
Implantable Pulse Generator Implant Date: 20240115
Lead Channel Impedance Value: 266 Ohm
Lead Channel Impedance Value: 418 Ohm
Lead Channel Impedance Value: 437 Ohm
Lead Channel Impedance Value: 532 Ohm
Lead Channel Pacing Threshold Amplitude: 1 V
Lead Channel Pacing Threshold Amplitude: 1.125 V
Lead Channel Pacing Threshold Pulse Width: 0.4 ms
Lead Channel Pacing Threshold Pulse Width: 0.4 ms
Lead Channel Sensing Intrinsic Amplitude: 1.5 mV
Lead Channel Sensing Intrinsic Amplitude: 1.5 mV
Lead Channel Sensing Intrinsic Amplitude: 7.875 mV
Lead Channel Sensing Intrinsic Amplitude: 7.875 mV
Lead Channel Setting Pacing Amplitude: 2 V
Lead Channel Setting Pacing Amplitude: 2.25 V
Lead Channel Setting Pacing Pulse Width: 0.4 ms
Lead Channel Setting Sensing Sensitivity: 0.9 mV
Zone Setting Status: 755011
Zone Setting Status: 755011

## 2023-05-22 NOTE — Progress Notes (Signed)
Remote pacemaker transmission.   

## 2023-07-06 DIAGNOSIS — M25511 Pain in right shoulder: Secondary | ICD-10-CM | POA: Diagnosis not present

## 2023-08-03 ENCOUNTER — Ambulatory Visit: Payer: PPO | Admitting: Dermatology

## 2023-08-04 ENCOUNTER — Ambulatory Visit (INDEPENDENT_AMBULATORY_CARE_PROVIDER_SITE_OTHER): Payer: PPO

## 2023-08-04 DIAGNOSIS — I495 Sick sinus syndrome: Secondary | ICD-10-CM

## 2023-08-04 LAB — CUP PACEART REMOTE DEVICE CHECK
Battery Remaining Longevity: 169 mo
Battery Voltage: 3.13 V
Brady Statistic AP VP Percent: 0.02 %
Brady Statistic AP VS Percent: 16.3 %
Brady Statistic AS VP Percent: 0.02 %
Brady Statistic AS VS Percent: 83.65 %
Brady Statistic RA Percent Paced: 16.37 %
Brady Statistic RV Percent Paced: 0.04 %
Date Time Interrogation Session: 20250114025451
Implantable Lead Connection Status: 753985
Implantable Lead Connection Status: 753985
Implantable Lead Implant Date: 20240115
Implantable Lead Implant Date: 20240115
Implantable Lead Location: 753859
Implantable Lead Location: 753860
Implantable Lead Model: 3830
Implantable Lead Model: 5076
Implantable Pulse Generator Implant Date: 20240115
Lead Channel Impedance Value: 285 Ohm
Lead Channel Impedance Value: 418 Ohm
Lead Channel Impedance Value: 456 Ohm
Lead Channel Impedance Value: 551 Ohm
Lead Channel Pacing Threshold Amplitude: 1 V
Lead Channel Pacing Threshold Amplitude: 1.25 V
Lead Channel Pacing Threshold Pulse Width: 0.4 ms
Lead Channel Pacing Threshold Pulse Width: 0.4 ms
Lead Channel Sensing Intrinsic Amplitude: 1.375 mV
Lead Channel Sensing Intrinsic Amplitude: 1.375 mV
Lead Channel Sensing Intrinsic Amplitude: 9.25 mV
Lead Channel Sensing Intrinsic Amplitude: 9.25 mV
Lead Channel Setting Pacing Amplitude: 2 V
Lead Channel Setting Pacing Amplitude: 2.5 V
Lead Channel Setting Pacing Pulse Width: 0.4 ms
Lead Channel Setting Sensing Sensitivity: 0.9 mV
Zone Setting Status: 755011
Zone Setting Status: 755011

## 2023-08-19 DIAGNOSIS — S46911A Strain of unspecified muscle, fascia and tendon at shoulder and upper arm level, right arm, initial encounter: Secondary | ICD-10-CM | POA: Diagnosis not present

## 2023-08-19 DIAGNOSIS — M75121 Complete rotator cuff tear or rupture of right shoulder, not specified as traumatic: Secondary | ICD-10-CM | POA: Diagnosis not present

## 2023-08-19 DIAGNOSIS — M19011 Primary osteoarthritis, right shoulder: Secondary | ICD-10-CM | POA: Diagnosis not present

## 2023-08-19 DIAGNOSIS — M75111 Incomplete rotator cuff tear or rupture of right shoulder, not specified as traumatic: Secondary | ICD-10-CM | POA: Diagnosis not present

## 2023-08-27 DIAGNOSIS — M25511 Pain in right shoulder: Secondary | ICD-10-CM | POA: Diagnosis not present

## 2023-09-15 NOTE — Progress Notes (Signed)
 Remote pacemaker transmission.

## 2023-09-21 DIAGNOSIS — M25511 Pain in right shoulder: Secondary | ICD-10-CM | POA: Diagnosis not present

## 2023-09-21 DIAGNOSIS — M6281 Muscle weakness (generalized): Secondary | ICD-10-CM | POA: Diagnosis not present

## 2023-09-28 ENCOUNTER — Other Ambulatory Visit: Payer: Self-pay | Admitting: Internal Medicine

## 2023-09-28 DIAGNOSIS — Z1231 Encounter for screening mammogram for malignant neoplasm of breast: Secondary | ICD-10-CM

## 2023-09-30 DIAGNOSIS — H401121 Primary open-angle glaucoma, left eye, mild stage: Secondary | ICD-10-CM | POA: Diagnosis not present

## 2023-09-30 DIAGNOSIS — H401112 Primary open-angle glaucoma, right eye, moderate stage: Secondary | ICD-10-CM | POA: Diagnosis not present

## 2023-09-30 DIAGNOSIS — Z961 Presence of intraocular lens: Secondary | ICD-10-CM | POA: Diagnosis not present

## 2023-10-14 IMAGING — MG MM DIGITAL SCREENING BILAT W/ TOMO AND CAD
8 series · 8 of 24 positions shown · non-contrast
Comparison: Previous exam(s).

CLINICAL DATA: Screening.

EXAM:
DIGITAL SCREENING BILATERAL MAMMOGRAM WITH TOMOSYNTHESIS AND CAD
TECHNIQUE: Bilateral screening digital craniocaudal and mediolateral oblique
mammograms were obtained. Bilateral screening digital breast
tomosynthesis was performed. The images were evaluated with
computer-aided detection.

[L MLO synth-2D]
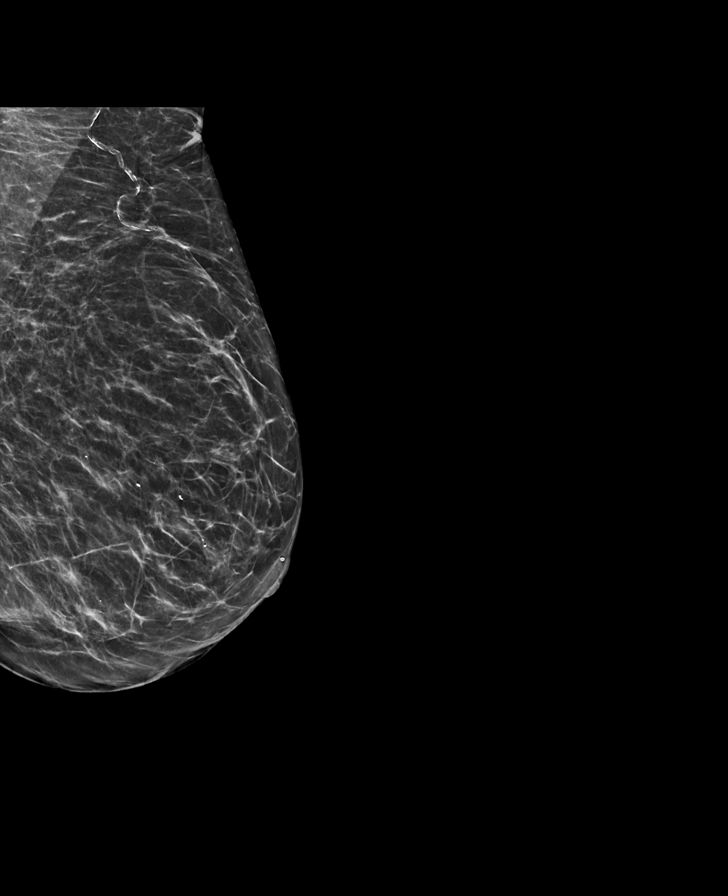

[L CC synth-2D]
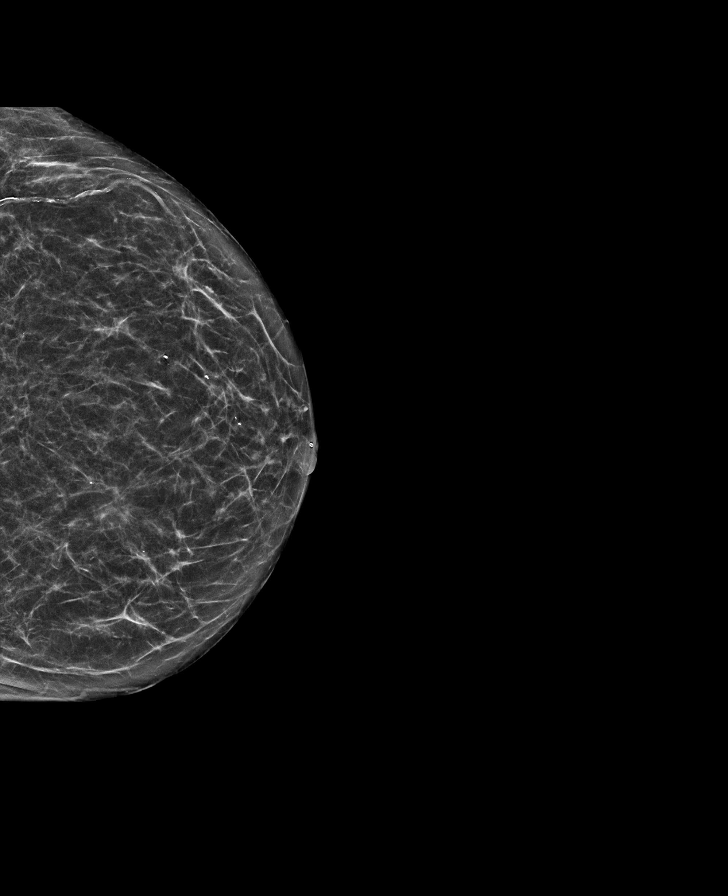

[R MLO synth-2D]
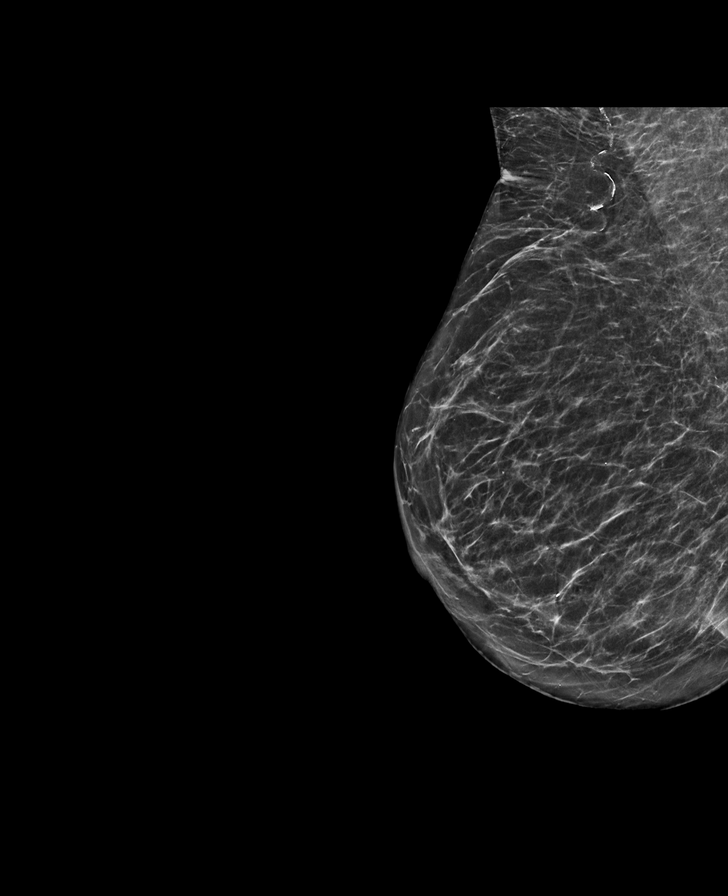

[R CC synth-2D]
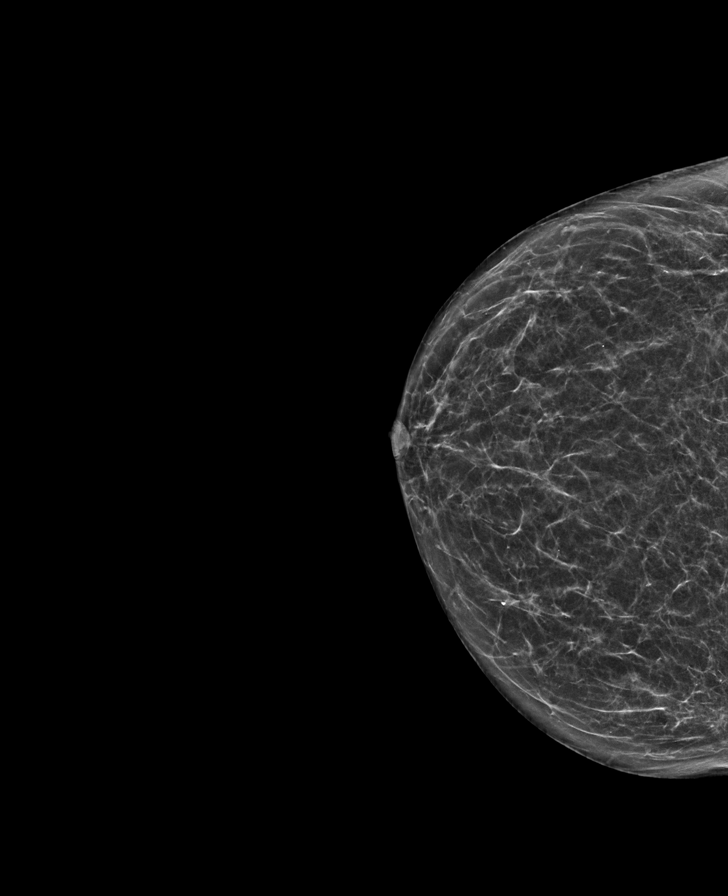

[R CC tomo · tomo slice 29/58.0]
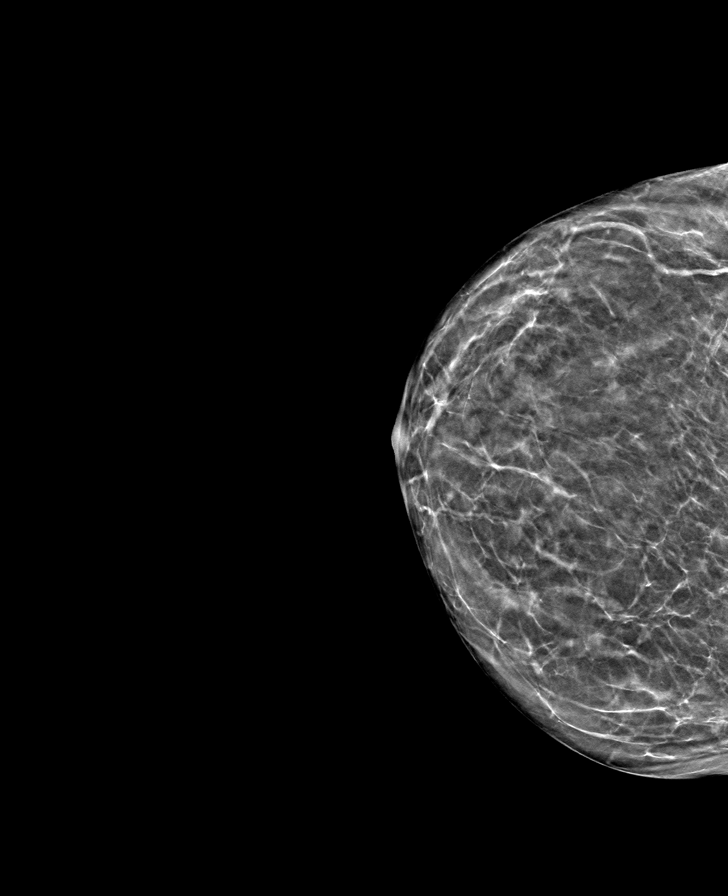

[R MLO tomo · tomo slice 33/66.0]
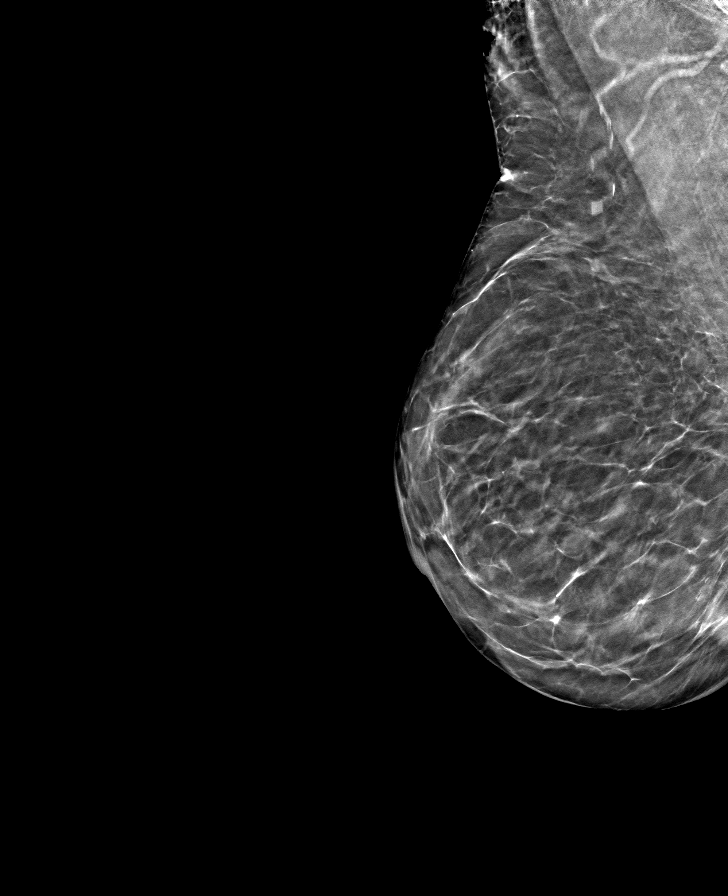

[L MLO tomo · tomo slice 33/66.0]
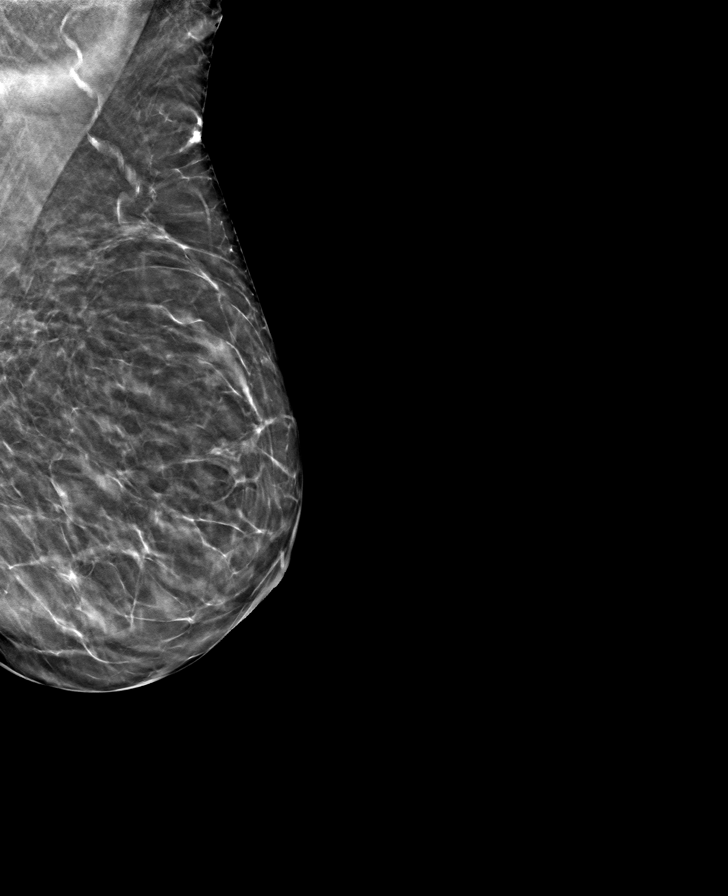

[L CC tomo · tomo slice 30/59.0]
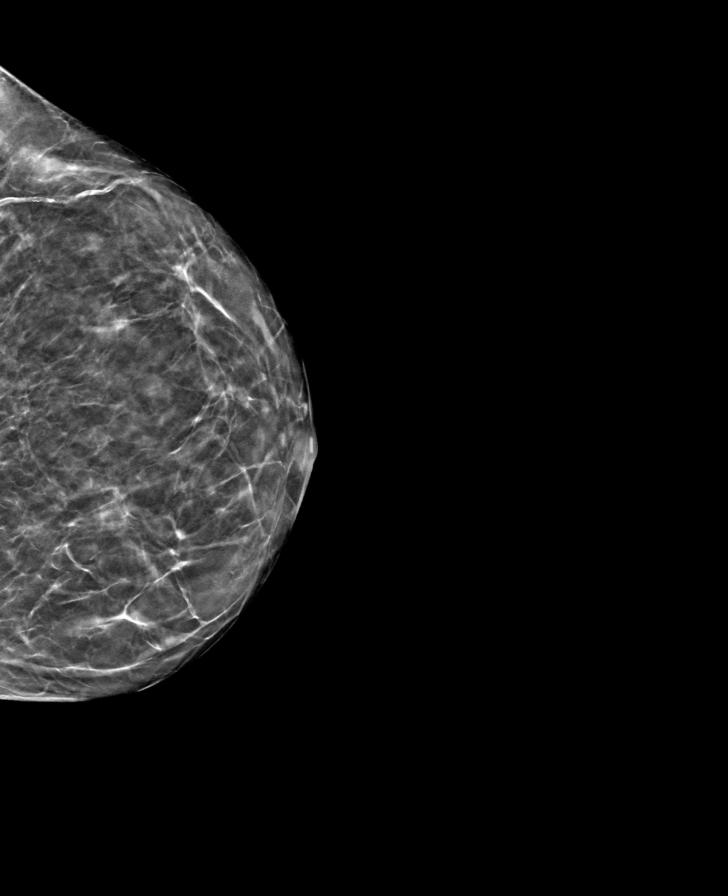

[8 of 24 positions shown; findings below may reference images not displayed]

ACR Breast Density Category b: There are scattered areas of
fibroglandular density.
FINDINGS: There are no findings suspicious for malignancy.
IMPRESSION: No mammographic evidence of malignancy. A result letter of this
screening mammogram will be mailed directly to the patient.

RECOMMENDATION:
Screening mammogram in one year. (Code:[id number])

BI-RADS CATEGORY  1: Negative.

## 2023-10-23 ENCOUNTER — Ambulatory Visit
Admission: RE | Admit: 2023-10-23 | Discharge: 2023-10-23 | Disposition: A | Discharge status: Home / Self Care | Source: Ambulatory Visit | Attending: Internal Medicine | Admitting: Internal Medicine

## 2023-10-23 DIAGNOSIS — Z1231 Encounter for screening mammogram for malignant neoplasm of breast: Secondary | ICD-10-CM | POA: Diagnosis not present

## 2023-10-28 DIAGNOSIS — H401131 Primary open-angle glaucoma, bilateral, mild stage: Secondary | ICD-10-CM | POA: Diagnosis not present

## 2023-11-03 ENCOUNTER — Ambulatory Visit: Payer: PPO

## 2023-11-03 DIAGNOSIS — I495 Sick sinus syndrome: Secondary | ICD-10-CM | POA: Diagnosis not present

## 2023-11-04 ENCOUNTER — Telehealth: Payer: Self-pay

## 2023-11-04 LAB — CUP PACEART REMOTE DEVICE CHECK
Battery Remaining Longevity: 167 mo
Battery Voltage: 3.09 V
Brady Statistic AP VP Percent: 0.04 %
Brady Statistic AP VS Percent: 14.66 %
Brady Statistic AS VP Percent: 0.05 %
Brady Statistic AS VS Percent: 85.25 %
Brady Statistic RA Percent Paced: 14.81 %
Brady Statistic RV Percent Paced: 0.11 %
Date Time Interrogation Session: 20250415005105
Implantable Lead Connection Status: 753985
Implantable Lead Connection Status: 753985
Implantable Lead Implant Date: 20240115
Implantable Lead Implant Date: 20240115
Implantable Lead Location: 753859
Implantable Lead Location: 753860
Implantable Lead Model: 3830
Implantable Lead Model: 5076
Implantable Pulse Generator Implant Date: 20240115
Lead Channel Impedance Value: 266 Ohm
Lead Channel Impedance Value: 418 Ohm
Lead Channel Impedance Value: 456 Ohm
Lead Channel Impedance Value: 513 Ohm
Lead Channel Pacing Threshold Amplitude: 0.875 V
Lead Channel Pacing Threshold Amplitude: 1.125 V
Lead Channel Pacing Threshold Pulse Width: 0.4 ms
Lead Channel Pacing Threshold Pulse Width: 0.4 ms
Lead Channel Sensing Intrinsic Amplitude: 2.125 mV
Lead Channel Sensing Intrinsic Amplitude: 2.125 mV
Lead Channel Sensing Intrinsic Amplitude: 7.875 mV
Lead Channel Sensing Intrinsic Amplitude: 7.875 mV
Lead Channel Setting Pacing Amplitude: 1.75 V
Lead Channel Setting Pacing Amplitude: 2.25 V
Lead Channel Setting Pacing Pulse Width: 0.4 ms
Lead Channel Setting Sensing Sensitivity: 0.9 mV
Zone Setting Status: 755011
Zone Setting Status: 755011

## 2023-11-04 NOTE — Telephone Encounter (Signed)
 Spoke to patients daughter Cheryl Yoder about AF and discussed AF clinic referral to discuss OAC. She is agreeable and could like for apts and further calls to be to her who assist patient with care/transportation.   Rea Cambridge [phone]

## 2023-11-04 NOTE — Telephone Encounter (Signed)
 Alert received from [employer] for new onset AF. 8 AF/AFL episodes, longest duration 1hr , controlled rates, burden <0.1%, all events 08/24/23.  No OAC or dx of AF noted in EPIC.    Spoke to patient, advised AF noted on report and would like pt to get an apt to discuss OAC. Pt requested her daughter call back when she is available to discuss further. Direct number given for call back.

## 2023-11-10 DIAGNOSIS — Z780 Asymptomatic menopausal state: Secondary | ICD-10-CM | POA: Diagnosis not present

## 2023-11-10 DIAGNOSIS — M8589 Other specified disorders of bone density and structure, multiple sites: Secondary | ICD-10-CM | POA: Diagnosis not present

## 2023-11-11 ENCOUNTER — Ambulatory Visit (HOSPITAL_COMMUNITY)
Admission: RE | Admit: 2023-11-11 | Discharge: 2023-11-11 | Disposition: A | Discharge status: Home / Self Care | Source: Ambulatory Visit | Attending: Internal Medicine | Admitting: Internal Medicine

## 2023-11-11 VITALS — BP 214/76 | HR 71 | Ht 63.0 in | Wt 120.4 lb

## 2023-11-11 DIAGNOSIS — E039 Hypothyroidism, unspecified: Secondary | ICD-10-CM | POA: Insufficient documentation

## 2023-11-11 DIAGNOSIS — D6869 Other thrombophilia: Secondary | ICD-10-CM | POA: Diagnosis not present

## 2023-11-11 DIAGNOSIS — Z7901 Long term (current) use of anticoagulants: Secondary | ICD-10-CM | POA: Insufficient documentation

## 2023-11-11 DIAGNOSIS — I10 Essential (primary) hypertension: Secondary | ICD-10-CM | POA: Insufficient documentation

## 2023-11-11 DIAGNOSIS — Z79899 Other long term (current) drug therapy: Secondary | ICD-10-CM | POA: Insufficient documentation

## 2023-11-11 DIAGNOSIS — Z79890 Hormone replacement therapy (postmenopausal): Secondary | ICD-10-CM | POA: Insufficient documentation

## 2023-11-11 DIAGNOSIS — I495 Sick sinus syndrome: Secondary | ICD-10-CM | POA: Diagnosis not present

## 2023-11-11 DIAGNOSIS — Z950 Presence of cardiac pacemaker: Secondary | ICD-10-CM | POA: Insufficient documentation

## 2023-11-11 DIAGNOSIS — I480 Paroxysmal atrial fibrillation: Secondary | ICD-10-CM

## 2023-11-11 MED ORDER — APIXABAN 2.5 MG PO TABS: 2.5000 mg | ORAL_TABLET | Freq: Two times a day (BID) | ORAL | 3 refills | Status: DC

## 2023-11-11 NOTE — Progress Notes (Addendum)
 Primary Care Physician: Azalia Leo, MD Primary Cardiologist: Antoinette Batman, MD Electrophysiologist: Will Cortland Ding, MD     Referring Physician: Device clinic     Cheryl Yoder is a 89 y.o. female with a history of possible TIA, HTN, sick sinus syndrome s/p PPM, hypothyroidism, and atrial fibrillation who presents for consultation in the [facility]. Device alert on 11/04/23 for new Afib (8 episodes) with longest duration 1 hr 19 minutes with controlled heart rates. Patient has a CHADS2VASC score of 4.  On evaluation today, she is in NSR. She does not drink alcohol . She did not have cardiac awareness of episodes.   Today, she denies symptoms of palpitations, chest pain, shortness of breath, orthopnea, PND, lower extremity edema, dizziness, presyncope, syncope, snoring, daytime somnolence, bleeding, or neurologic sequela. The patient is tolerating medications without difficulties and is otherwise without complaint today.   she has a BMI of Body mass index is 21.33 kg/m.Aaron Aas Filed Weights   11/11/23 0816  Weight: 54.6 kg    Current Outpatient Medications  Medication Sig Dispense Refill   acetaminophen  (TYLENOL ) 500 MG tablet Take 1,000 mg by mouth as needed for mild pain (pain score 1-3).     calcium carbonate (CALCIUM 600) 600 MG TABS tablet Take 600 mg by mouth daily with breakfast.     Cholecalciferol (VITAMIN D3) 50 MCG (2000 UT) capsule Take 2,000 Units by mouth daily.     hydrALAZINE  (APRESOLINE ) 25 MG tablet Take 1 tablet (25 mg total) by mouth 3 (three) times daily. (Patient taking differently: Take 12.5 mg by mouth 2 (two) times daily.) 270 tablet 3   latanoprost  (XALATAN ) 0.005 % ophthalmic solution Place 1 drop into both eyes at bedtime.     levothyroxine  (SYNTHROID , LEVOTHROID) 75 MCG tablet Take 75 mcg by mouth daily before breakfast. Monday - Saturday first thing in the morning before breakfast     losartan  (COZAAR ) 100 MG tablet  Take 100 mg by mouth at bedtime.      pravastatin  (PRAVACHOL ) 20 MG tablet Take 1 tablet (20 mg total) by mouth daily at 6 PM. 30 tablet 2   No current facility-administered medications for this encounter.    Atrial Fibrillation Management history:  Previous antiarrhythmic drugs: none Previous cardioversions: none Previous ablations: none Anticoagulation history: none   ROS- All systems are reviewed and negative except as per the HPI above.  Physical Exam: BP (!) 214/76   Pulse 71   Ht 5\' 3"  (1.6 m)   Wt 54.6 kg   BMI 21.33 kg/m   GEN: Well nourished, well developed in no acute distress NECK: No JVD; No carotid bruits CARDIAC: Regular rate and rhythm, no murmurs, rubs, gallops RESPIRATORY:  Clear to auscultation without rales, wheezing or rhonchi  ABDOMEN: Soft, non-tender, non-distended EXTREMITIES:  No edema; No deformity   EKG today demonstrates  Vent. rate 71 BPM PR interval 194 ms QRS duration 80 ms QT/QTcB 388/421 ms P-R-T axes 80 69 67 Normal sinus rhythm Normal ECG When compared with ECG of 14-Jan-2023 16:41, PREVIOUS ECG IS PRESENT  Echo 05/06/22 demonstrated  1. No apical thrombus with Definity  contrast. Left ventricular ejection  fraction, by estimation, is 55 to 60%. The left ventricle has normal  function. The left ventricle has no regional wall motion abnormalities.  Left ventricular diastolic parameters  are consistent with Grade I diastolic dysfunction (impaired relaxation).   2. Right ventricular systolic function is low normal. The right  ventricular size is  normal. There is normal pulmonary artery systolic  pressure.   3. The mitral valve is grossly normal. Trivial mitral valve  regurgitation.   4. The aortic valve is tricuspid. Aortic valve regurgitation is not  visualized.   5. The inferior vena cava is normal in size with <50% respiratory  variability, suggesting right atrial pressure of 8 mmHg.   Comparison(s): No prior Echocardiogram.    ASSESSMENT & PLAN CHA2DS2-VASc Score = 4  The patient's score is based upon: CHF History: 0 HTN History: 1 Diabetes History: 0 Stroke History: 0 Vascular Disease History: 0 Age Score: 2 Gender Score: 1       ASSESSMENT AND PLAN: Paroxysmal Atrial Fibrillation (ICD10:  I48.0) The patient's CHA2DS2-VASc score is 4, indicating a 4.8% annual risk of stroke.    She is currently in NSR. Education provided about Afib. We had a long discussion about risks vs benefit of anticoagulation. She did not have cardiac awareness of episodes, they were brief, and noted to have controlled rates. We will not begin any rate control at this time. Continue with current management and remote observation of rhythm via PPM checks.    Secondary Hypercoagulable State (ICD10:  D68.69) The patient is at significant risk for stroke/thromboembolism based upon her CHA2DS2-VASc Score of 4.  Start Apixaban  (Eliquis ).  After discussion, patient does wish to begin anticoagulation. Will begin Eliquis  2.5 mg BID due to age and weight; CBC in 1 month.   Hypertension Patient notes her blood pressure is always significantly elevated at medical office. Home readings are in the 130-140s systolic. Repeat BP 190/72. Recommended to please monitor at home and trend; may need to increase hydralazine  dosage. Advised to speak with PCP regarding HTN management since hydralazine  at higher doses has made her dizzy.   Follow up 1 year Afib clinic.    Minnie Amber, PA-C  [facility] [address] [place], [place] [address] [phone]

## 2023-11-11 NOTE — Patient Instructions (Addendum)
 Start Eliquis  2.5mg  twice a day  STOP plavix    Have your labs drawn in 1 month. Bring order slip to the [facility] on the 1st floor within the [facility] Vascular building. [address]. You do not need to be fasting for the labwork.    Follow up in 1 year.  The Atrial Fibrillation Clinic is located in the [facility] at [address] [place] [address].  Patients should enter through main entrance and proceed immediately to Elevators to the 4th floor. Once you exit the elevator on the 4th floor check in with ZONE B registration for the Afib Clinic. Valet parking is available at the entrance or the parking garage is adjacent to the building. Phone number: [phone]

## 2023-11-13 DIAGNOSIS — E039 Hypothyroidism, unspecified: Secondary | ICD-10-CM | POA: Diagnosis not present

## 2023-11-13 DIAGNOSIS — I129 Hypertensive chronic kidney disease with stage 1 through stage 4 chronic kidney disease, or unspecified chronic kidney disease: Secondary | ICD-10-CM | POA: Diagnosis not present

## 2023-11-13 DIAGNOSIS — M8580 Other specified disorders of bone density and structure, unspecified site: Secondary | ICD-10-CM | POA: Diagnosis not present

## 2023-11-13 DIAGNOSIS — N182 Chronic kidney disease, stage 2 (mild): Secondary | ICD-10-CM | POA: Diagnosis not present

## 2023-11-20 DIAGNOSIS — I700 Atherosclerosis of aorta: Secondary | ICD-10-CM | POA: Diagnosis not present

## 2023-11-20 DIAGNOSIS — I495 Sick sinus syndrome: Secondary | ICD-10-CM | POA: Diagnosis not present

## 2023-11-20 DIAGNOSIS — E039 Hypothyroidism, unspecified: Secondary | ICD-10-CM | POA: Diagnosis not present

## 2023-11-20 DIAGNOSIS — I480 Paroxysmal atrial fibrillation: Secondary | ICD-10-CM | POA: Diagnosis not present

## 2023-11-20 DIAGNOSIS — I129 Hypertensive chronic kidney disease with stage 1 through stage 4 chronic kidney disease, or unspecified chronic kidney disease: Secondary | ICD-10-CM | POA: Diagnosis not present

## 2023-11-20 DIAGNOSIS — Z0000 Encounter for general adult medical examination without abnormal findings: Secondary | ICD-10-CM | POA: Diagnosis not present

## 2023-11-20 DIAGNOSIS — Z8673 Personal history of transient ischemic attack (TIA), and cerebral infarction without residual deficits: Secondary | ICD-10-CM | POA: Diagnosis not present

## 2023-11-20 DIAGNOSIS — Z1339 Encounter for screening examination for other mental health and behavioral disorders: Secondary | ICD-10-CM | POA: Diagnosis not present

## 2023-11-20 DIAGNOSIS — H409 Unspecified glaucoma: Secondary | ICD-10-CM | POA: Diagnosis not present

## 2023-11-20 DIAGNOSIS — M8580 Other specified disorders of bone density and structure, unspecified site: Secondary | ICD-10-CM | POA: Diagnosis not present

## 2023-11-20 DIAGNOSIS — L659 Nonscarring hair loss, unspecified: Secondary | ICD-10-CM | POA: Diagnosis not present

## 2023-11-20 DIAGNOSIS — Z1331 Encounter for screening for depression: Secondary | ICD-10-CM | POA: Diagnosis not present

## 2023-11-20 DIAGNOSIS — N182 Chronic kidney disease, stage 2 (mild): Secondary | ICD-10-CM | POA: Diagnosis not present

## 2023-11-20 DIAGNOSIS — Z8509 Personal history of malignant neoplasm of other digestive organs: Secondary | ICD-10-CM | POA: Diagnosis not present

## 2023-12-18 NOTE — Addendum Note (Signed)
 Addended by: Lott Rouleau A on: 12/18/2023 11:01 AM   Modules accepted: Orders

## 2023-12-18 NOTE — Progress Notes (Signed)
 Remote pacemaker transmission.

## 2023-12-21 ENCOUNTER — Other Ambulatory Visit (HOSPITAL_COMMUNITY): Payer: Self-pay | Admitting: Internal Medicine

## 2023-12-21 DIAGNOSIS — I480 Paroxysmal atrial fibrillation: Secondary | ICD-10-CM | POA: Diagnosis not present

## 2023-12-21 LAB — CBC
Hematocrit: 37.2 % (ref 34.0–46.6)
Hemoglobin: 12.1 g/dL (ref 11.1–15.9)
MCH: 29.7 pg (ref 26.6–33.0)
MCHC: 32.5 g/dL (ref 31.5–35.7)
MCV: 91 fL (ref 79–97)
Platelets: 245 10*3/uL (ref 150–450)
RBC: 4.07 x10E6/uL (ref 3.77–5.28)
RDW: 12 % (ref 11.7–15.4)
WBC: 5.9 10*3/uL (ref 3.4–10.8)

## 2023-12-22 ENCOUNTER — Ambulatory Visit (HOSPITAL_COMMUNITY): Payer: Self-pay | Admitting: Internal Medicine

## 2024-02-02 ENCOUNTER — Ambulatory Visit (INDEPENDENT_AMBULATORY_CARE_PROVIDER_SITE_OTHER): Payer: PPO

## 2024-02-02 DIAGNOSIS — I495 Sick sinus syndrome: Secondary | ICD-10-CM | POA: Diagnosis not present

## 2024-02-02 LAB — CUP PACEART REMOTE DEVICE CHECK
Battery Remaining Longevity: 163 mo
Battery Voltage: 3.06 V
Brady Statistic AP VP Percent: 0.15 %
Brady Statistic AP VS Percent: 12 %
Brady Statistic AS VP Percent: 0.18 %
Brady Statistic AS VS Percent: 87.67 %
Brady Statistic RA Percent Paced: 12.78 %
Brady Statistic RV Percent Paced: 0.33 %
Date Time Interrogation Session: 20250715032742
Implantable Lead Connection Status: 753985
Implantable Lead Connection Status: 753985
Implantable Lead Implant Date: 20240115
Implantable Lead Implant Date: 20240115
Implantable Lead Location: 753859
Implantable Lead Location: 753860
Implantable Lead Model: 3830
Implantable Lead Model: 5076
Implantable Pulse Generator Implant Date: 20240115
Lead Channel Impedance Value: 247 Ohm
Lead Channel Impedance Value: 380 Ohm
Lead Channel Impedance Value: 399 Ohm
Lead Channel Impedance Value: 513 Ohm
Lead Channel Pacing Threshold Amplitude: 0.875 V
Lead Channel Pacing Threshold Amplitude: 1.125 V
Lead Channel Pacing Threshold Pulse Width: 0.4 ms
Lead Channel Pacing Threshold Pulse Width: 0.4 ms
Lead Channel Sensing Intrinsic Amplitude: 1.75 mV
Lead Channel Sensing Intrinsic Amplitude: 1.75 mV
Lead Channel Sensing Intrinsic Amplitude: 7.625 mV
Lead Channel Sensing Intrinsic Amplitude: 7.625 mV
Lead Channel Setting Pacing Amplitude: 1.75 V
Lead Channel Setting Pacing Amplitude: 2.25 V
Lead Channel Setting Pacing Pulse Width: 0.4 ms
Lead Channel Setting Sensing Sensitivity: 0.9 mV
Zone Setting Status: 755011
Zone Setting Status: 755011

## 2024-02-03 ENCOUNTER — Ambulatory Visit: Payer: Self-pay | Admitting: Cardiology

## 2024-02-05 ENCOUNTER — Encounter: Payer: Self-pay | Admitting: Advanced Practice Midwife

## 2024-03-15 ENCOUNTER — Other Ambulatory Visit (HOSPITAL_COMMUNITY): Payer: Self-pay | Admitting: Internal Medicine

## 2024-04-07 ENCOUNTER — Ambulatory Visit: Payer: PPO | Admitting: Dermatology

## 2024-04-26 ENCOUNTER — Other Ambulatory Visit (HOSPITAL_BASED_OUTPATIENT_CLINIC_OR_DEPARTMENT_OTHER): Payer: Self-pay

## 2024-04-27 ENCOUNTER — Other Ambulatory Visit (HOSPITAL_BASED_OUTPATIENT_CLINIC_OR_DEPARTMENT_OTHER): Payer: Self-pay

## 2024-04-27 ENCOUNTER — Other Ambulatory Visit (HOSPITAL_COMMUNITY): Payer: Self-pay

## 2024-04-27 MED ORDER — HYDRALAZINE HCL 25 MG PO TABS
12.5000 mg | ORAL_TABLET | Freq: Two times a day (BID) | ORAL | 1 refills | Status: AC
  Filled 2024-07-11: qty 90, 90d supply, fill #0

## 2024-04-27 MED ORDER — PRAVASTATIN SODIUM 20 MG PO TABS
20.0000 mg | ORAL_TABLET | Freq: Every day | ORAL | 2 refills | Status: AC
  Filled 2024-05-23: qty 90, 90d supply, fill #0
  Filled 2024-08-24: qty 90, 90d supply, fill #1

## 2024-04-27 MED ORDER — DORZOLAMIDE HCL 2 % OP SOLN
1.0000 [drp] | Freq: Two times a day (BID) | OPHTHALMIC | 4 refills | Status: AC
  Filled 2024-07-11: qty 10, 50d supply, fill #0

## 2024-04-27 MED ORDER — HYDRALAZINE HCL 25 MG PO TABS: 12.5000 mg | ORAL_TABLET | Freq: Two times a day (BID) | ORAL | 2 refills | Status: AC

## 2024-04-27 MED ORDER — LEVOTHYROXINE SODIUM 75 MCG PO TABS
75.0000 ug | ORAL_TABLET | Freq: Every day | ORAL | 3 refills | Status: AC
  Filled 2024-07-11: qty 90, 90d supply, fill #0

## 2024-04-27 MED ORDER — LOSARTAN POTASSIUM 100 MG PO TABS
100.0000 mg | ORAL_TABLET | Freq: Every day | ORAL | 3 refills | Status: DC
  Filled 2024-04-27: qty 90, 90d supply, fill #0

## 2024-04-27 MED ORDER — PRAVASTATIN SODIUM 20 MG PO TABS: 20.0000 mg | ORAL_TABLET | Freq: Every day | ORAL | 2 refills | Status: AC

## 2024-04-27 MED ORDER — LATANOPROST 0.005 % OP SOLN
1.0000 [drp] | Freq: Every day | OPHTHALMIC | 3 refills | Status: AC
  Filled 2024-04-27: qty 10, 100d supply, fill #0

## 2024-04-27 MED ORDER — LOSARTAN POTASSIUM 100 MG PO TABS: 100.0000 mg | ORAL_TABLET | Freq: Every day | ORAL | 3 refills | Status: AC

## 2024-04-27 NOTE — Progress Notes (Signed)
 Remote PPM Transmission

## 2024-04-28 ENCOUNTER — Other Ambulatory Visit: Payer: Self-pay

## 2024-05-03 ENCOUNTER — Ambulatory Visit: Payer: PPO

## 2024-05-03 DIAGNOSIS — I495 Sick sinus syndrome: Secondary | ICD-10-CM

## 2024-05-04 DIAGNOSIS — H401131 Primary open-angle glaucoma, bilateral, mild stage: Secondary | ICD-10-CM | POA: Diagnosis not present

## 2024-05-04 LAB — CUP PACEART REMOTE DEVICE CHECK
Battery Remaining Longevity: 159 mo
Battery Voltage: 3.05 V
Brady Statistic AP VP Percent: 0.06 %
Brady Statistic AP VS Percent: 21.87 %
Brady Statistic AS VP Percent: 0.04 %
Brady Statistic AS VS Percent: 78.02 %
Brady Statistic RA Percent Paced: 22.12 %
Brady Statistic RV Percent Paced: 0.1 %
Date Time Interrogation Session: 20251013235806
Implantable Lead Connection Status: 753985
Implantable Lead Connection Status: 753985
Implantable Lead Implant Date: 20240115
Implantable Lead Implant Date: 20240115
Implantable Lead Location: 753859
Implantable Lead Location: 753860
Implantable Lead Model: 3830
Implantable Lead Model: 5076
Implantable Pulse Generator Implant Date: 20240115
Lead Channel Impedance Value: 247 Ohm
Lead Channel Impedance Value: 323 Ohm
Lead Channel Impedance Value: 418 Ohm
Lead Channel Impedance Value: 513 Ohm
Lead Channel Pacing Threshold Amplitude: 0.75 V
Lead Channel Pacing Threshold Amplitude: 1.125 V
Lead Channel Pacing Threshold Pulse Width: 0.4 ms
Lead Channel Pacing Threshold Pulse Width: 0.4 ms
Lead Channel Sensing Intrinsic Amplitude: 1.5 mV
Lead Channel Sensing Intrinsic Amplitude: 1.5 mV
Lead Channel Sensing Intrinsic Amplitude: 7.75 mV
Lead Channel Sensing Intrinsic Amplitude: 7.75 mV
Lead Channel Setting Pacing Amplitude: 1.75 V
Lead Channel Setting Pacing Amplitude: 2.25 V
Lead Channel Setting Pacing Pulse Width: 0.4 ms
Lead Channel Setting Sensing Sensitivity: 0.9 mV
Zone Setting Status: 755011
Zone Setting Status: 755011

## 2024-05-05 NOTE — Progress Notes (Signed)
 Remote PPM Transmission

## 2024-05-11 ENCOUNTER — Ambulatory Visit: Payer: Self-pay | Admitting: Cardiology

## 2024-05-23 ENCOUNTER — Other Ambulatory Visit (HOSPITAL_COMMUNITY): Payer: Self-pay

## 2024-05-23 MED FILL — Apixaban Tab 2.5 MG: ORAL | 30 days supply | Qty: 60 | Fill #0 | Status: AC

## 2024-06-20 ENCOUNTER — Other Ambulatory Visit (HOSPITAL_COMMUNITY): Payer: Self-pay

## 2024-06-20 MED FILL — Apixaban Tab 2.5 MG: ORAL | 30 days supply | Qty: 60 | Fill #1 | Status: AC

## 2024-07-11 ENCOUNTER — Other Ambulatory Visit (HOSPITAL_COMMUNITY): Payer: Self-pay

## 2024-07-11 ENCOUNTER — Other Ambulatory Visit: Payer: Self-pay

## 2024-07-22 ENCOUNTER — Other Ambulatory Visit (HOSPITAL_COMMUNITY): Payer: Self-pay

## 2024-07-22 MED FILL — Apixaban Tab 2.5 MG: ORAL | 30 days supply | Qty: 60 | Fill #2 | Status: AC

## 2024-08-02 ENCOUNTER — Ambulatory Visit: Payer: PPO

## 2024-08-02 DIAGNOSIS — I495 Sick sinus syndrome: Secondary | ICD-10-CM

## 2024-08-03 ENCOUNTER — Ambulatory Visit: Payer: Self-pay | Admitting: Cardiology

## 2024-08-03 LAB — CUP PACEART REMOTE DEVICE CHECK
Battery Remaining Longevity: 153 mo
Battery Voltage: 3.04 V
Brady Statistic AP VP Percent: 0.03 %
Brady Statistic AP VS Percent: 19 %
Brady Statistic AS VP Percent: 0.02 %
Brady Statistic AS VS Percent: 80.95 %
Brady Statistic RA Percent Paced: 19.11 %
Brady Statistic RV Percent Paced: 0.06 %
Date Time Interrogation Session: 20260113022935
Implantable Lead Connection Status: 753985
Implantable Lead Connection Status: 753985
Implantable Lead Implant Date: 20240115
Implantable Lead Implant Date: 20240115
Implantable Lead Location: 753859
Implantable Lead Location: 753860
Implantable Lead Model: 3830
Implantable Lead Model: 5076
Implantable Pulse Generator Implant Date: 20240115
Lead Channel Impedance Value: 247 Ohm
Lead Channel Impedance Value: 361 Ohm
Lead Channel Impedance Value: 399 Ohm
Lead Channel Impedance Value: 513 Ohm
Lead Channel Pacing Threshold Amplitude: 1.125 V
Lead Channel Pacing Threshold Amplitude: 1.25 V
Lead Channel Pacing Threshold Pulse Width: 0.4 ms
Lead Channel Pacing Threshold Pulse Width: 0.4 ms
Lead Channel Sensing Intrinsic Amplitude: 1.375 mV
Lead Channel Sensing Intrinsic Amplitude: 1.375 mV
Lead Channel Sensing Intrinsic Amplitude: 7.875 mV
Lead Channel Sensing Intrinsic Amplitude: 7.875 mV
Lead Channel Setting Pacing Amplitude: 2.25 V
Lead Channel Setting Pacing Amplitude: 2.5 V
Lead Channel Setting Pacing Pulse Width: 0.4 ms
Lead Channel Setting Sensing Sensitivity: 0.9 mV
Zone Setting Status: 755011
Zone Setting Status: 755011

## 2024-08-05 NOTE — Progress Notes (Signed)
 Remote PPM Transmission

## 2024-08-24 ENCOUNTER — Other Ambulatory Visit (HOSPITAL_COMMUNITY): Payer: Self-pay

## 2024-08-24 ENCOUNTER — Other Ambulatory Visit: Payer: Self-pay

## 2024-08-24 MED ORDER — LOSARTAN POTASSIUM 100 MG PO TABS
100.0000 mg | ORAL_TABLET | Freq: Every day | ORAL | 1 refills | Status: AC
  Filled 2024-08-24: qty 90, 90d supply, fill #0

## 2024-08-24 MED FILL — Apixaban Tab 2.5 MG: ORAL | 30 days supply | Qty: 60 | Fill #3 | Status: AC

## 2024-11-01 ENCOUNTER — Ambulatory Visit

## 2024-11-10 ENCOUNTER — Ambulatory Visit (HOSPITAL_COMMUNITY): Admitting: Internal Medicine

## 2025-01-31 ENCOUNTER — Ambulatory Visit

## 2025-05-02 ENCOUNTER — Ambulatory Visit
